# Patient Record
Sex: Female | Born: 1980
Health system: Southern US, Community
[De-identification: ages and names within clinical notes are randomized; demographics above are authoritative.]

## PROBLEM LIST (undated history)

## (undated) DIAGNOSIS — F419 Anxiety disorder, unspecified: Secondary | ICD-10-CM

## (undated) HISTORY — DX: Anxiety disorder, unspecified: F41.9

## (undated) HISTORY — PX: WISDOM TOOTH EXTRACTION: SHX21

## (undated) HISTORY — PX: OTHER SURGICAL HISTORY: SHX169

---

## 2004-10-06 ENCOUNTER — Observation Stay: Payer: Self-pay

## 2004-10-07 ENCOUNTER — Inpatient Hospital Stay: Payer: Self-pay | Admitting: Unknown Physician Specialty

## 2004-10-09 ENCOUNTER — Other Ambulatory Visit: Payer: Self-pay

## 2006-02-06 ENCOUNTER — Observation Stay: Payer: Self-pay

## 2006-02-10 ENCOUNTER — Inpatient Hospital Stay: Payer: Self-pay | Admitting: Unknown Physician Specialty

## 2014-01-29 ENCOUNTER — Emergency Department: Payer: Self-pay | Admitting: Internal Medicine

## 2015-12-03 ENCOUNTER — Encounter: Payer: Self-pay | Admitting: Family Medicine

## 2015-12-03 ENCOUNTER — Ambulatory Visit (INDEPENDENT_AMBULATORY_CARE_PROVIDER_SITE_OTHER): Payer: 59 | Admitting: Family Medicine

## 2015-12-03 VITALS — BP 110/70 | HR 70 | Temp 98.2°F | Resp 16 | Wt 122.8 lb

## 2015-12-03 DIAGNOSIS — R300 Dysuria: Secondary | ICD-10-CM | POA: Diagnosis not present

## 2015-12-03 LAB — POCT URINALYSIS DIPSTICK
Bilirubin, UA: NEGATIVE
Glucose, UA: NEGATIVE
Ketones, UA: NEGATIVE
Nitrite, UA: NEGATIVE
Protein, UA: NEGATIVE
SPEC GRAV UA: 1.015
UROBILINOGEN UA: 0.2
pH, UA: 6

## 2015-12-03 MED ORDER — SULFAMETHOXAZOLE-TRIMETHOPRIM 800-160 MG PO TABS: 1.0000 | ORAL_TABLET | Freq: Two times a day (BID) | ORAL | 0 refills | Status: DC

## 2015-12-03 NOTE — Progress Notes (Signed)
       Patient: Mary EdelmanChristine Michele Jacobo Melton Female    DOB: 1980/05/06   35 y.o.   MRN: [id number] Visit Date: 12/03/2015  Today's Provider: Dortha Kernennis Graycen Degan, PA   Chief Complaint  Patient presents with  . Urinary Tract Infection   Subjective:    Urinary Tract Infection   This is a new problem. Episode onset: Last Tuesday. The problem occurs every urination. The problem has been gradually worsening. The quality of the pain is described as burning. Maximum temperature: Chills/fever yesterday did not checked. Associated symptoms include chills, frequency and urgency. Pertinent negatives include no discharge or hematuria. Associated symptoms comments: Painful Urination and burning. She has tried nothing for the symptoms. The treatment provided no relief.   There are no active problems to display for this patient.  No past medical history on file. No past surgical history on file. No family history on file.   No Known Allergies  No current outpatient prescriptions on file.  Review of Systems  Constitutional: Positive for chills.  Cardiovascular: Negative for chest pain, palpitations and leg swelling.  Gastrointestinal: Positive for abdominal pain (Lower right side).  Genitourinary: Positive for dysuria, frequency and urgency. Negative for hematuria.    Social History  Substance Use Topics  . Smoking status: Current Every Day Smoker    Packs/day: 0.50    Types: Cigarettes  . Smokeless tobacco: Not on file  . Alcohol use Yes     Comment: Occasional   Objective:   BP 110/70 (BP Location: Right Arm, Patient Position: Sitting, Cuff Size: Normal)   Pulse 70   Temp 98.2 F (36.8 C) (Oral)   Resp 16   Wt 122 lb 12.8 oz (55.7 kg)   LMP 11/17/2015   Physical Exam  Constitutional: She is oriented to person, place, and time. She appears well-developed and well-nourished.  HENT:  Head: Normocephalic.  Eyes: Conjunctivae are normal.  Neck: Neck supple.  Cardiovascular:  Normal rate.   Pulmonary/Chest: Effort normal and breath sounds normal.  Abdominal: Soft. Bowel sounds are normal.  Suprapubic discomfort to palpation. Questionable CVA tenderness to percussion posteriorly. No masses.  Neurological: She is alert and oriented to person, place, and time.      Assessment & Plan:     1. Dysuria Onset 1 week ago. No fever today. No hematuria but having some burning with urination. Increase fluid intake and start antibiotic. Will get C&S and recheck pending report. - POCT urinalysis dipstick - Urine culture - sulfamethoxazole-trimethoprim (BACTRIM DS,SEPTRA DS) 800-160 MG tablet; Take 1 tablet by mouth 2 (two) times daily.  Dispense: 14 tablet; Refill: 0       Dortha Kernennis Sharyah Bostwick, PA  [facility]

## 2015-12-05 LAB — URINE CULTURE: ORGANISM ID, BACTERIA: NO GROWTH

## 2015-12-06 ENCOUNTER — Telehealth: Payer: Self-pay

## 2015-12-06 NOTE — Telephone Encounter (Signed)
-----   Message from Tamsen Roersennis E Chrismon, [place] sent at 12/06/2015  8:19 AM EDT ----- No bacterial growth on urine culture. Probably don't need more than 3 days of the antibiotic given. May stop it by Friday. Recheck next week if any urinary tract symptoms.

## 2015-12-06 NOTE — Telephone Encounter (Signed)
Patient has been advised. KW 

## 2016-01-04 ENCOUNTER — Encounter: Payer: Self-pay | Admitting: Family Medicine

## 2016-01-04 ENCOUNTER — Ambulatory Visit (INDEPENDENT_AMBULATORY_CARE_PROVIDER_SITE_OTHER): Payer: 59 | Admitting: Family Medicine

## 2016-01-04 VITALS — BP 98/72 | HR 81 | Temp 97.9°F | Resp 16 | Wt 123.2 lb

## 2016-01-04 DIAGNOSIS — Q828 Other specified congenital malformations of skin: Secondary | ICD-10-CM

## 2016-01-04 NOTE — Progress Notes (Signed)
   Patient: Mary EdelmanChristine Michele Jacobo Hernandez Female    DOB: February 28, 1980   35 y.o.   MRN: [id number] Visit Date: 01/04/2016  Today's Provider: Dortha Kernennis Caylor Cerino, PA   Chief Complaint  Patient presents with  . Foot Pain   Subjective:    Foot Pain  This is a new problem. The current episode started 1 to 4 weeks ago. Episode frequency: when walking. The problem has been unchanged.  Patient reports corns on ball of left foot into the great toe. She states area is only painful when walking.     Previous Medications   No medications on file    Review of Systems  Constitutional: Negative.   Respiratory: Negative.   Cardiovascular: Negative.   Musculoskeletal:       Foot pain     Social History  Substance Use Topics  . Smoking status: Current Every Day Smoker    Packs/day: 0.50    Types: Cigarettes  . Smokeless tobacco: Not on file  . Alcohol use Yes     Comment: Occasional   Objective:   BP 98/72 (BP Location: Right Arm, Patient Position: Sitting, Cuff Size: Normal)   Pulse 81   Temp 97.9 F (36.6 C) (Oral)   Resp 16   Wt 123 lb 3.2 oz (55.9 kg)   Physical Exam  Constitutional: She is oriented to person, place, and time. She appears well-developed and well-nourished. No distress.  HENT:  Head: Normocephalic and atraumatic.  Right Ear: Hearing normal.  Left Ear: Hearing normal.  Nose: Nose normal.  Eyes: Conjunctivae and lids are normal. Right eye exhibits no discharge. Left eye exhibits no discharge. No scleral icterus.  Pulmonary/Chest: Effort normal. No respiratory distress.  Musculoskeletal: Normal range of motion.       Feet:  Neurological: She is alert and oriented to person, place, and time.  Skin: Skin is intact. No lesion noted.  Psychiatric: She has a normal mood and affect. Her speech is normal and behavior is normal. Thought content normal.      Assessment & Plan:     1. Porokeratosis Five small 2-3 mm callus/keratotic lesion on the plantar surface of  the left foot present for the past 3 weeks. Tried some corn pads with acid for the past couple days.. Still having discomfort. Trimmed with scalpel and advised to buff with pumice stone to smooth and remove dry skin. Has an appointment to be evaluated by a podiatrist on 01-22-16. Keep appointment if still uncomfortable. Recheck prn.

## 2016-01-22 ENCOUNTER — Ambulatory Visit (INDEPENDENT_AMBULATORY_CARE_PROVIDER_SITE_OTHER): Payer: 59 | Admitting: Podiatry

## 2016-01-22 ENCOUNTER — Encounter: Payer: Self-pay | Admitting: Podiatry

## 2016-01-22 VITALS — BP 107/76 | HR 64

## 2016-01-22 DIAGNOSIS — B070 Plantar wart: Secondary | ICD-10-CM | POA: Diagnosis not present

## 2016-01-22 DIAGNOSIS — M79672 Pain in left foot: Secondary | ICD-10-CM | POA: Diagnosis not present

## 2016-01-24 ENCOUNTER — Ambulatory Visit (INDEPENDENT_AMBULATORY_CARE_PROVIDER_SITE_OTHER): Payer: 59 | Admitting: Podiatry

## 2016-01-24 ENCOUNTER — Telehealth: Payer: Self-pay | Admitting: *Deleted

## 2016-01-24 DIAGNOSIS — S90822D Blister (nonthermal), left foot, subsequent encounter: Secondary | ICD-10-CM | POA: Diagnosis not present

## 2016-01-24 DIAGNOSIS — M79672 Pain in left foot: Secondary | ICD-10-CM

## 2016-01-24 DIAGNOSIS — B070 Plantar wart: Secondary | ICD-10-CM

## 2016-01-24 NOTE — Telephone Encounter (Signed)
Pt states the area where Dr. Logan BoresEvans worked on her foot looks infected. I spoke with pt and she states the area is bigger than the others and she called and got an appt for today. I told her that on occasion when the wart dies, the fluids from the wart are left beneath the skin causing swelling and pain, often get relief from soaking but best that she has an appt.

## 2016-01-25 NOTE — Progress Notes (Signed)
Subjective:  Patient presents today for follow-up evaluation of painful wart lesions. Patient recently had debridement of plantar verruca with chemical application on 01/22/2016. Patient called into the office this morning complaining of painful blister lesions. Patient presents today for further evaluation    Objective/Physical Exam General: The patient is alert and oriented x3 in no acute distress.  Dermatology: Blister rations noted to the plantar aspect of the forefoot where the plantar verruca were treated. No sign of infectious process noted. Blister lesions are intact and stable. Painful on palpation. Skin is warm, dry and supple bilateral lower extremities. Negative for open lesions or macerations.  Vascular: Palpable pedal pulses bilaterally. No edema or erythema noted. Capillary refill within normal limits.  Neurological: Epicritic and protective threshold grossly intact bilaterally.   Musculoskeletal Exam: Range of motion within normal limits to all pedal and ankle joints bilateral. Muscle strength 5/5 in all groups bilateral.   Assessment: #1 status post debridement of verruca lesions plantar forefoot #2 pain in plantar forefoot #3 blisters, multiple, stable plantar weightbearing surface of the forefoot   Plan of Care:  #1 Patient was evaluated. #2 today we discussed in detail the pathology of verruca lesions and how to manage them. Reaffirmed with the patient that this is a normal finding in the blisters are of normal progression. Discussed with the patient that we are ready discussed about the presentation of blisters after chemical application to the plantar verruca. #3 offloading, padded dressing was applied to the forefoot. #4 postoperative shoe dispensed today. #5 return to clinic on next appointment   Felecia ShellingBrent M. Tarra Pence, DPM [facility]  Dr. Felecia ShellingBrent M. Jamison Yuhasz, DPM   [address]                                        [place], [place] [address]                 Office [phone]  Fax [phone]

## 2016-01-29 ENCOUNTER — Ambulatory Visit: Payer: 59 | Admitting: Podiatry

## 2016-02-01 ENCOUNTER — Ambulatory Visit (INDEPENDENT_AMBULATORY_CARE_PROVIDER_SITE_OTHER): Payer: 59 | Admitting: Podiatry

## 2016-02-01 ENCOUNTER — Encounter: Payer: Self-pay | Admitting: Podiatry

## 2016-02-01 DIAGNOSIS — M79672 Pain in left foot: Secondary | ICD-10-CM

## 2016-02-01 DIAGNOSIS — B070 Plantar wart: Secondary | ICD-10-CM | POA: Diagnosis not present

## 2016-02-01 MED ORDER — NONFORMULARY OR COMPOUNDED ITEM: 1.0000 g | Freq: Every day | 2 refills | Status: DC

## 2016-02-01 NOTE — Progress Notes (Signed)
Subjective: Patient presents today with pain and tenderness on the plantar aspect of the left foot secondary to a plantars wart. Patient states that the pain has been present for several weeks now. Patient denies trauma.  Objective: Physical Exam General: The patient is alert and oriented x3 in no acute distress.  Dermatology: Hyperkeratotic skin lesion noted to the plantar aspect of the left foot approximately 1 cm in diameter. Pinpoint bleeding noted upon debridement. Skin is warm, dry and supple bilateral lower extremities. Negative for open lesions or macerations.  Vascular: Palpable pedal pulses bilaterally. No edema or erythema noted. Capillary refill within normal limits.  Neurological: Epicritic and protective threshold grossly intact bilaterally.   Musculoskeletal Exam: Pain on palpation to the note skin lesion.  Range of motion within normal limits to all pedal and ankle joints bilateral. Muscle strength 5/5 in all groups bilateral.   Assessment: #1 plantar wart left foot #2 pain in left foot   Plan of Care:  #1 Patient was evaluated. #2 Excisional debridement of the plantar wart lesion was performed using a chisel blade. Cantharone was applied and the lesion was dressed with a dry sterile dressing. #3 patient is to return to clinic in 1 week.  Next visit [id number]

## 2016-02-01 NOTE — Progress Notes (Signed)
Subjective: Patient presents today for follow-up evaluation of a plantar wart to the left lower extremity. Patient presents today for follow-up treatment and evaluation  Objective: Physical Exam General: The patient is alert and oriented x3 in no acute distress.  Dermatology: Hyperkeratotic skin lesion noted to the plantar aspect of the left foot approximately 1 cm in diameter. Pinpoint bleeding noted upon debridement. Skin is warm, dry and supple bilateral lower extremities. Negative for open lesions or macerations.  Vascular: Palpable pedal pulses bilaterally. No edema or erythema noted. Capillary refill within normal limits.  Neurological: Epicritic and protective threshold grossly intact bilaterally.   Musculoskeletal Exam: Pain on palpation to the note skin lesion.  Range of motion within normal limits to all pedal and ankle joints bilateral. Muscle strength 5/5 in all groups bilateral.   Assessment: #1 plantar wart left foot #2 pain in left foot   Plan of Care:  #1 Patient was evaluated. #2 Excisional debridement of the plantar wart lesion was performed using a chisel blade. Salicylic acid was applied and the lesion was dressed with a dry sterile dressing. #3 [facility] wart cream [facility] #4 return to clinic prn   Felecia ShellingBrent M. Alahia Whicker, DPM [facility]  Dr. Felecia ShellingBrent M. Meggan Dhaliwal, DPM   [address]                                        [place], [place] [address]                Office [phone]  Fax [phone]

## 2016-09-05 IMAGING — CT CT MAXILLOFACIAL WITHOUT CONTRAST
3 series · 16 of 47 positions shown, 19 images · non-contrast
Comparison: None

CLINICAL DATA: Was doing hand stand and fell striking face last
night, injuries to upper lip and bridge of nose

EXAM:
CT MAXILLOFACIAL WITHOUT CONTRAST
TECHNIQUE: Multidetector CT imaging of the maxillofacial structures was
performed. Multiplanar CT image reconstructions were also generated.
A small metallic BB was placed on the right temple in order to
reliably differentiate right from left.

[Series 2: max soft · axial · 0.27mm/px · z∈[-196,-70]mm · 10 of 73 slices shown, 13 images]
[im 5/73  brain]
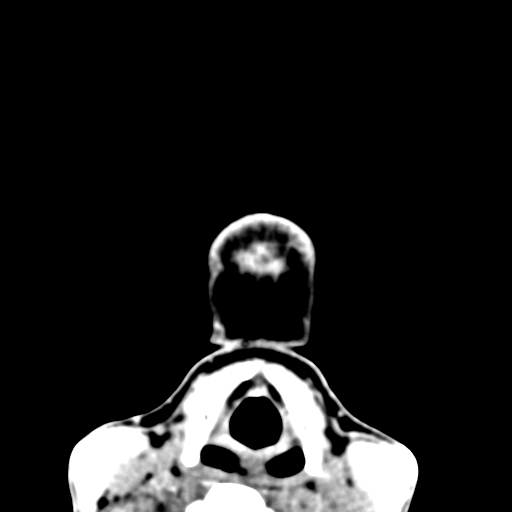
[im 5/73  bone]
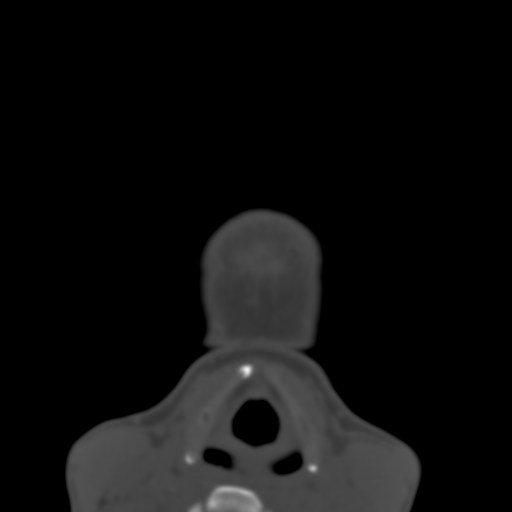
[im 13/73  bone]
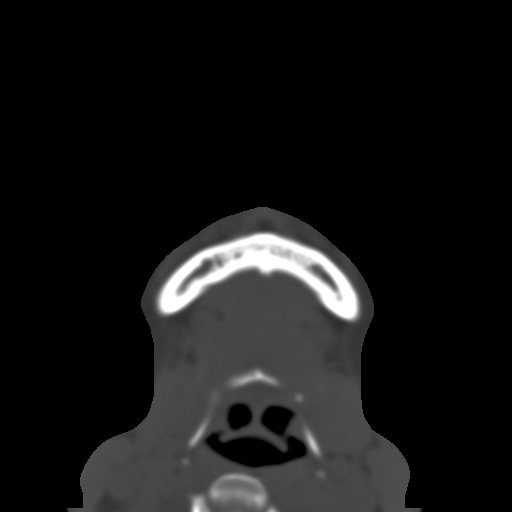
[im 20/73  bone]
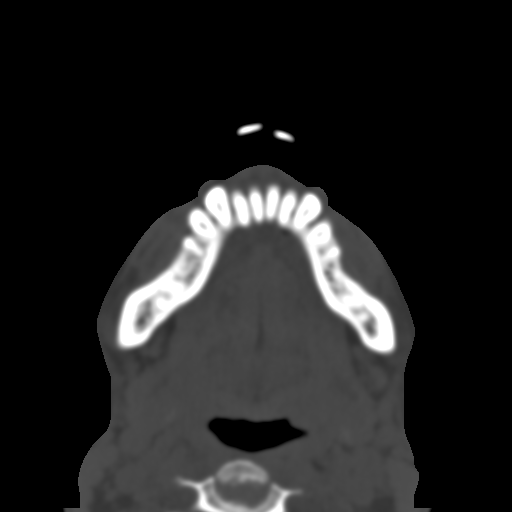
[im 25/73  bone]
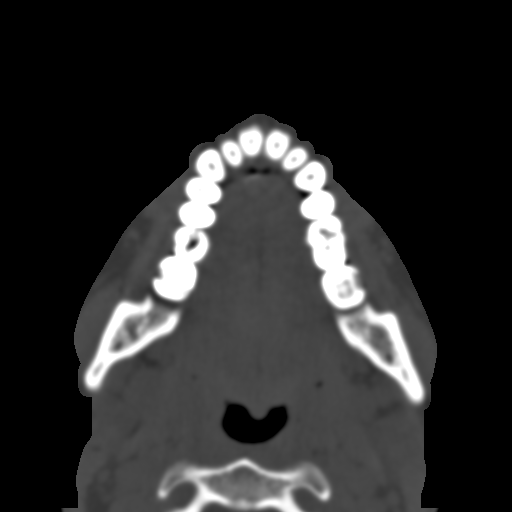
[im 33/73  brain]
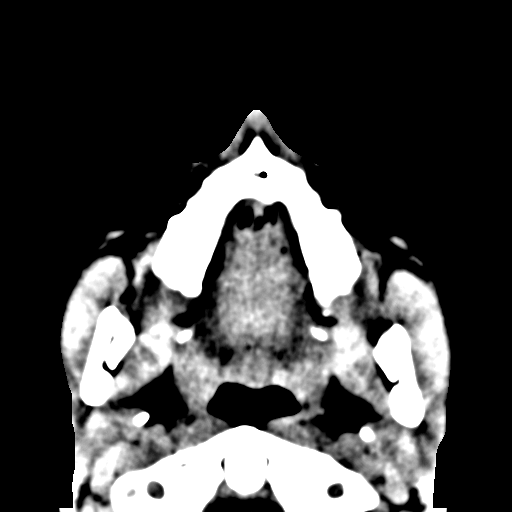
[im 33/73  bone]
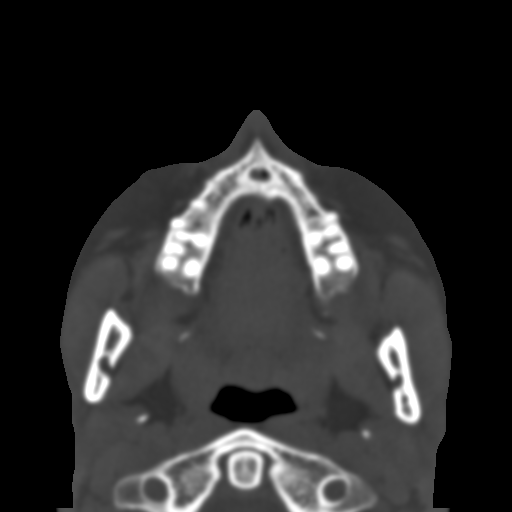
[im 40/73  bone]
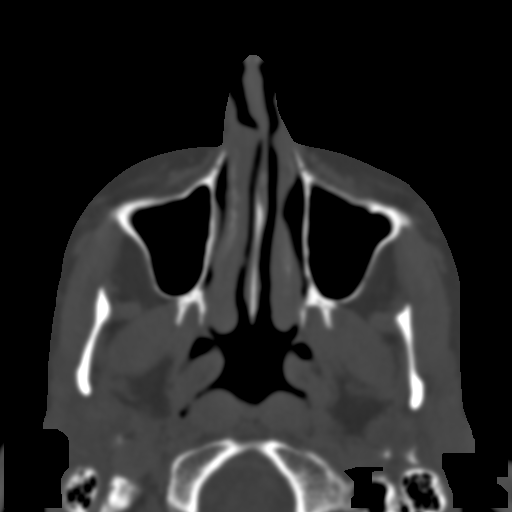
[im 48/73  bone]
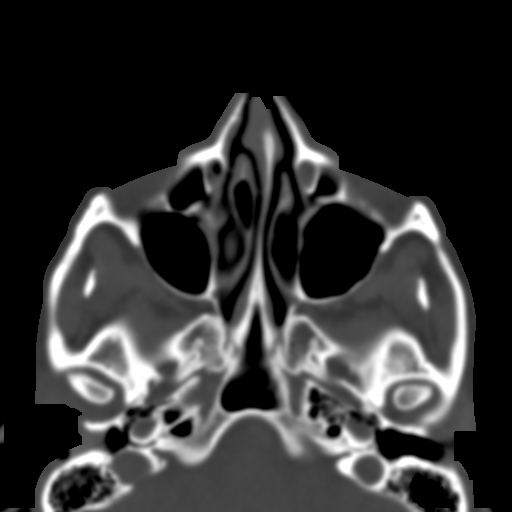
[im 55/73  bone]
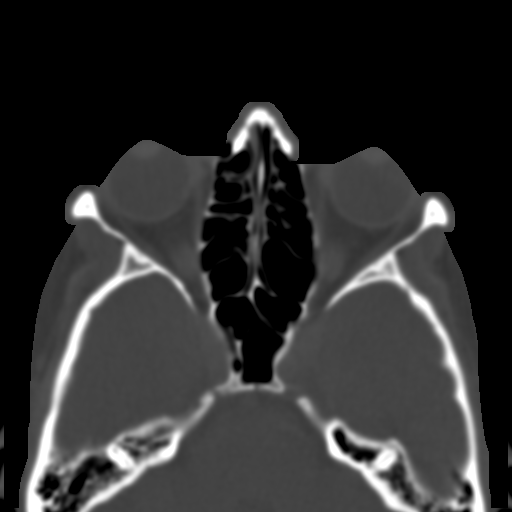
[im 60/73  brain]
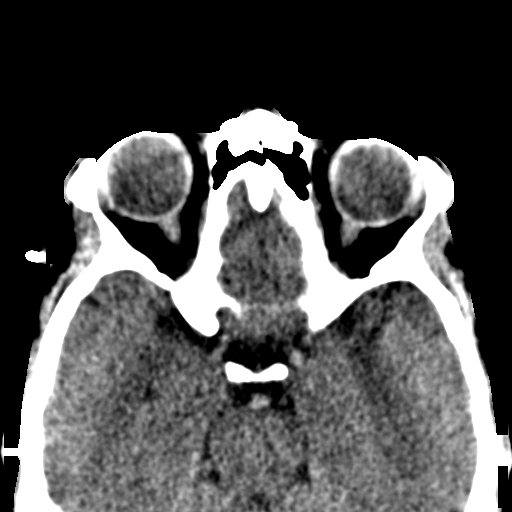
[im 60/73  bone]
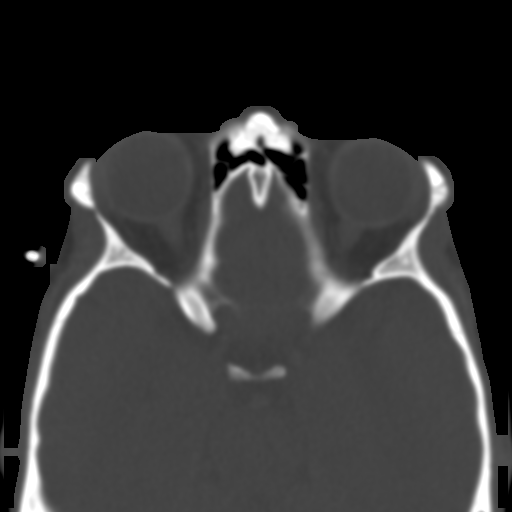
[im 68/73  bone]
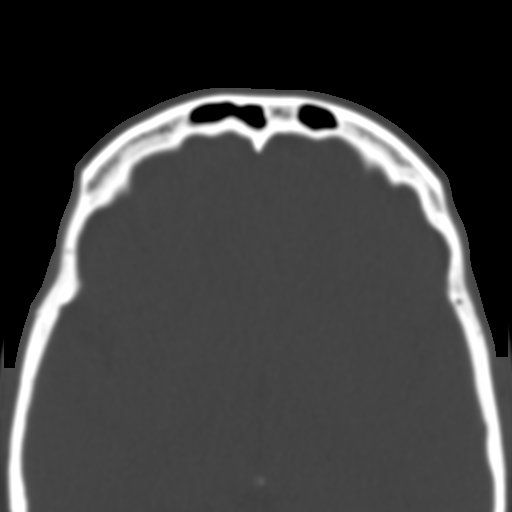

[Series 4: coronal soft · coronal · 0.28mm/px · 3 of 60 slices shown]
[im 20/60  bone]
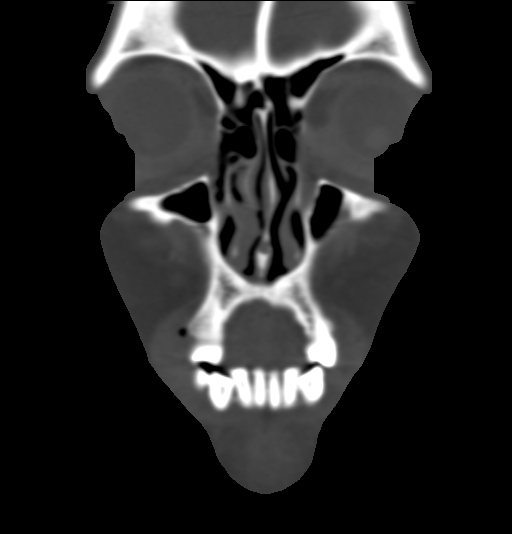
[im 27/60  bone]
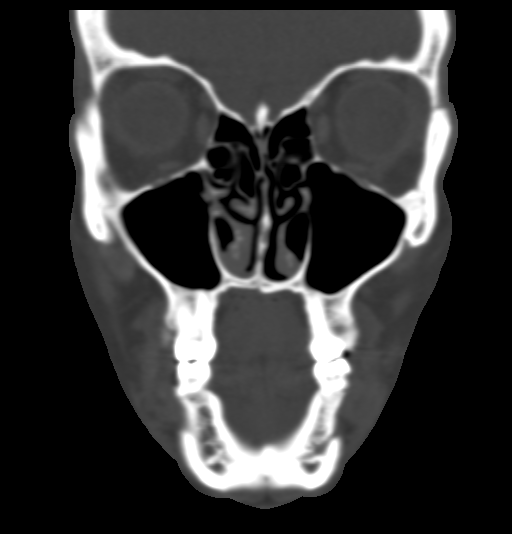
[im 33/60  bone]
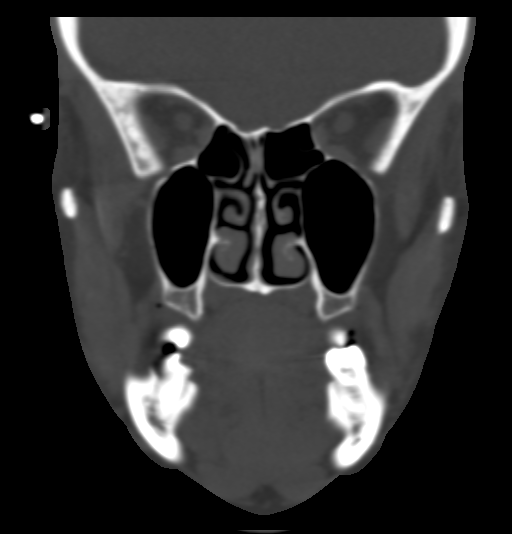

[Series 5: sagittal soft · sagittal · 0.24mm/px · 3 of 67 slices shown]
[im 23/67  bone]
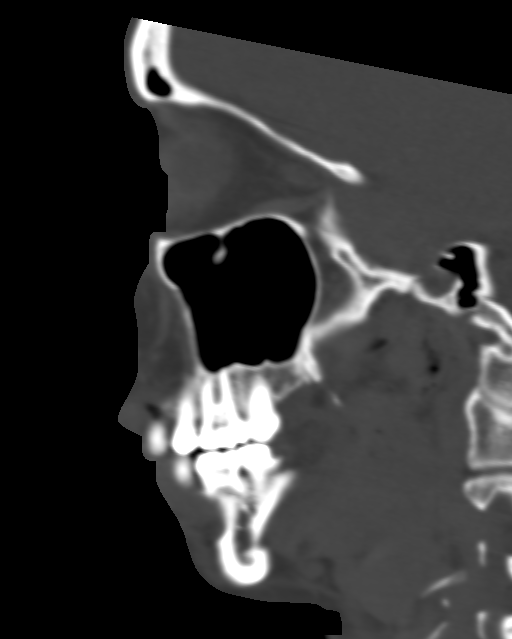
[im 34/67  bone]
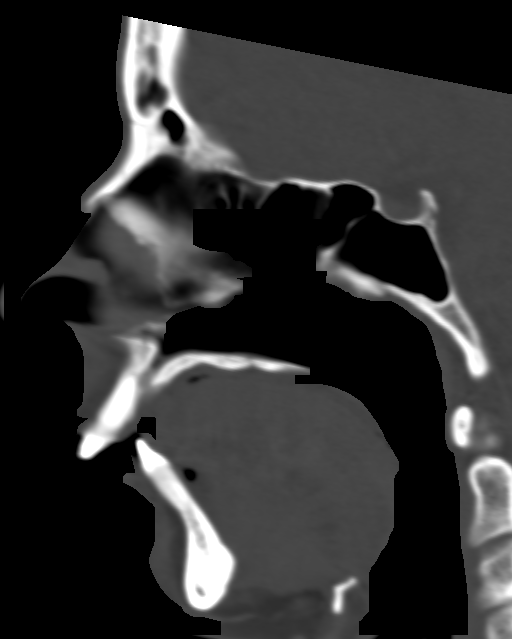
[im 45/67  bone]
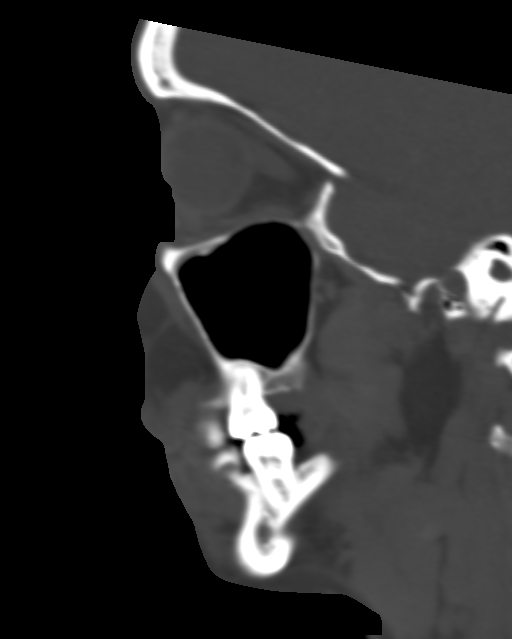

[16 of 47 positions shown; findings below may reference images not displayed]

FINDINGS: Visualized intracranial structures unremarkable.

Intraorbital soft tissue planes clear.

Facial soft tissues otherwise unremarkable.

Paranasal sinuses, visualized mastoid air cells and middle ear
cavities clear.

Material in RIGHT external auditory canal question cerumen.

Nasal septal deviation to the LEFT with concha bullosa of the RIGHT
middle terminate.

No facial bone fractures identified.
IMPRESSION: No acute facial bone abnormalities identified.

## 2017-04-01 ENCOUNTER — Other Ambulatory Visit: Payer: Self-pay | Admitting: Family Medicine

## 2017-04-01 DIAGNOSIS — B852 Pediculosis, unspecified: Secondary | ICD-10-CM

## 2017-04-01 MED ORDER — IVERMECTIN 0.5 % EX LOTN: TOPICAL_LOTION | CUTANEOUS | 0 refills | Status: DC

## 2017-04-01 NOTE — Telephone Encounter (Signed)
[place] [facility] [place]  Lice is back and needs RX for the entire family.

## 2017-04-01 NOTE — Telephone Encounter (Signed)
Sent in

## 2017-06-16 ENCOUNTER — Encounter: Payer: Self-pay | Admitting: Family Medicine

## 2017-06-16 ENCOUNTER — Ambulatory Visit (INDEPENDENT_AMBULATORY_CARE_PROVIDER_SITE_OTHER): Payer: Self-pay | Admitting: Family Medicine

## 2017-06-16 VITALS — BP 106/68 | HR 68 | Temp 98.1°F | Ht 66.0 in | Wt 121.6 lb

## 2017-06-16 DIAGNOSIS — H6121 Impacted cerumen, right ear: Secondary | ICD-10-CM

## 2017-06-16 NOTE — Progress Notes (Signed)
       Patient: Mary Melton Female    DOB: 1981-01-06   37 y.o.   MRN: [id number] Visit Date: 06/16/2017  Today's Provider: Dortha Kern, PA   Chief Complaint  Patient presents with  . Ear Fullness   Subjective:    Ear Fullness   There is pain in the right ear. This is a new problem. Episode onset: 5 days ago. The problem occurs constantly. The problem has been gradually worsening. There has been no fever. Associated symptoms comments: Tinnitus . Treatments tried: OTC wax removal. The treatment provided no relief. There is no history of a chronic ear infection, hearing loss or a tympanostomy tube.   History reviewed. No pertinent past medical history.   Past Surgical History:  Procedure Laterality Date  . arm surgery    . CESAREAN SECTION     2  . WISDOM TOOTH EXTRACTION     Family History  Problem Relation Age of Onset  . COPD Mother   . Healthy Sister   . Diabetes Maternal Grandmother   . Diabetes Maternal Grandfather    No Known Allergies  No current outpatient medications on file.  Review of Systems  Constitutional: Negative.   HENT: Ear pain: ear fullness.   Respiratory: Negative.   Cardiovascular: Negative.     Social History   Tobacco Use  . Smoking status: Current Every Day Smoker    Types: E-cigarettes  . Smokeless tobacco: Never Used  Substance Use Topics  . Alcohol use: Yes    Comment: Occasional   Objective:   BP 106/68 (BP Location: Right Arm, Patient Position: Sitting, Cuff Size: Normal)   Pulse 68   Temp 98.1 F (36.7 C) (Oral)   Ht  (1.676 m)   Wt 121 lb 9.6 oz (55.2 kg)   SpO2 99%   BMI 19.63 kg/m   Physical Exam  Constitutional: She is oriented to person, place, and time. She appears well-developed and well-nourished. No distress.  HENT:  Head: Normocephalic and atraumatic.  Right Ear: Hearing normal.  Left Ear: Hearing and external ear normal.  Nose: Nose normal.  Cerumen impaction occluding the  right ear canal.  Eyes: Conjunctivae and lids are normal. Right eye exhibits no discharge. Left eye exhibits no discharge. No scleral icterus.  Pulmonary/Chest: Effort normal and breath sounds normal. No respiratory distress.  Musculoskeletal: Normal range of motion.  Neurological: She is alert and oriented to person, place, and time.  Skin: Skin is intact. No lesion and no rash noted.  Psychiatric: She has a normal mood and affect. Her speech is normal and behavior is normal. Thought content normal.      Assessment & Plan:     1. Impacted cerumen of right ear Noticed a stopped up sensation in the right ear the past 5 days. Has progressed to some discomfort and decrease in hearing. Cerumen impaction removed with irrigation and TM with normal reflex - no redness or irritation. Recheck prn.       Dortha Kern, PA  [facility]

## 2018-10-19 ENCOUNTER — Ambulatory Visit (INDEPENDENT_AMBULATORY_CARE_PROVIDER_SITE_OTHER): Payer: Self-pay | Admitting: Family Medicine

## 2018-10-19 ENCOUNTER — Encounter: Payer: Self-pay | Admitting: Family Medicine

## 2018-10-19 ENCOUNTER — Other Ambulatory Visit: Payer: Self-pay

## 2018-10-19 DIAGNOSIS — Z20822 Contact with and (suspected) exposure to covid-19: Secondary | ICD-10-CM

## 2018-10-19 DIAGNOSIS — Z20828 Contact with and (suspected) exposure to other viral communicable diseases: Secondary | ICD-10-CM

## 2018-10-19 NOTE — Progress Notes (Signed)
Mary EdelmanChristine Michele Jacobo Melton  MRN: [id number] DOB: 1980-09-14 Virtual Visit via Telephone Note  I connected with Mary Edelmanhristine Michele Jacobo Melton on 10/19/18 at  2:00 PM EDT by telephone and verified that I am speaking with the correct person using two identifiers.  Location: Patient: Home  Provider: Home   I discussed the limitations, risks, security and privacy concerns of performing an evaluation and management service by telephone and the availability of in person appointments. I also discussed with the patient that there may be a patient responsible charge related to this service. The patient expressed understanding and agreed to proceed.  Subjective:  HPI   The patient is a 38 year old female who is being evaluated today via phone interview.  She is requesting a Covid 19 test as she thinks she was exposed by a co-worker.    There are no active problems to display for this patient.  No past medical history on file.   Past Surgical History:  Procedure Laterality Date  . arm surgery    . CESAREAN SECTION     2  . WISDOM TOOTH EXTRACTION     Social History   Socioeconomic History  . Marital status: Divorced    Spouse name: Not on file  . Number of children: Not on file  . Years of education: Not on file  . Highest education level: Not on file  Occupational History  . Not on file  Social Needs  . Financial resource strain: Not on file  . Food insecurity    Worry: Not on file    Inability: Not on file  . Transportation needs    Medical: Not on file    Non-medical: Not on file  Tobacco Use  . Smoking status: Current Every Day Smoker    Types: E-cigarettes  . Smokeless tobacco: Never Used  Substance and Sexual Activity  . Alcohol use: Yes    Comment: Occasional  . Drug use: No  . Sexual activity: Yes  Lifestyle  . Physical activity    Days per week: Not on file    Minutes per session: Not on file  . Stress: Not on file  Relationships  . Social  Musicianconnections    Talks on phone: Not on file    Gets together: Not on file    Attends religious service: Not on file    Active member of club or organization: Not on file    Attends meetings of clubs or organizations: Not on file    Relationship status: Not on file  . Intimate partner violence    Fear of current or ex partner: Not on file    Emotionally abused: Not on file    Physically abused: Not on file    Forced sexual activity: Not on file  Other Topics Concern  . Not on file  Social History Narrative  . Not on file    No outpatient encounter medications on file as of 10/19/2018.   No facility-administered encounter medications on file as of 10/19/2018.     No Known Allergies  Review of Systems  Constitutional: Negative.   HENT: Negative.   Eyes: Negative.   Respiratory: Negative.   Cardiovascular: Negative.   Neurological: Negative for weakness.    Objective:  There were no vitals taken for this visit.  No apparent cough/respiratory symptoms during telephonic interview.   Assessment and Plan :   1. 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 This patient has not had any fever, cough, sore throat, congestion, loss of taste/smell, GI upset or dyspnea. Placed order for COVID test and should monitor for symptoms or fever. Must isolate until free of fever for 3 days after test results available without taking any antipyretic. If positive test, will be isolating for 14 days. Should wear mask at home when around her family or isolate in a separate room. Continue social distancing and frequent handwashing. - Novel Coronavirus, N[facility] ([facility])  I discussed the assessment and treatment plan with the patient. The patient was provided an opportunity to ask questions and all were answered. The patient agreed with the plan and demonstrated an understanding of the instructions.   The patient was advised to call  back or seek an in-person evaluation if the symptoms worsen or if the condition fails to improve as anticipated.  I provided 15 minutes of non-face-to-face time during this encounter.   [name], PA

## 2018-10-20 ENCOUNTER — Other Ambulatory Visit: Payer: Self-pay | Admitting: *Deleted

## 2018-10-20 ENCOUNTER — Encounter (INDEPENDENT_AMBULATORY_CARE_PROVIDER_SITE_OTHER): Payer: Self-pay

## 2018-10-20 DIAGNOSIS — Z20822 Contact with and (suspected) exposure to covid-19: Secondary | ICD-10-CM

## 2018-10-21 ENCOUNTER — Encounter (INDEPENDENT_AMBULATORY_CARE_PROVIDER_SITE_OTHER): Payer: Self-pay

## 2018-10-21 LAB — NOVEL CORONAVIRUS, NAA: SARS-CoV-2, NAA: NOT DETECTED

## 2018-10-22 ENCOUNTER — Encounter (INDEPENDENT_AMBULATORY_CARE_PROVIDER_SITE_OTHER): Payer: Self-pay

## 2018-10-28 ENCOUNTER — Encounter (INDEPENDENT_AMBULATORY_CARE_PROVIDER_SITE_OTHER): Payer: Self-pay

## 2018-12-27 ENCOUNTER — Other Ambulatory Visit: Payer: Self-pay

## 2018-12-27 DIAGNOSIS — Z20822 Contact with and (suspected) exposure to covid-19: Secondary | ICD-10-CM

## 2018-12-28 LAB — NOVEL CORONAVIRUS, NAA: SARS-CoV-2, NAA: NOT DETECTED

## 2019-04-08 ENCOUNTER — Other Ambulatory Visit: Payer: Self-pay

## 2019-04-08 ENCOUNTER — Ambulatory Visit (INDEPENDENT_AMBULATORY_CARE_PROVIDER_SITE_OTHER): Payer: Self-pay | Admitting: Family Medicine

## 2019-04-08 ENCOUNTER — Encounter: Payer: Self-pay | Admitting: Family Medicine

## 2019-04-08 VITALS — BP 112/73 | HR 69 | Temp 97.1°F | Resp 16 | Ht 66.0 in | Wt 165.0 lb

## 2019-04-08 DIAGNOSIS — H6121 Impacted cerumen, right ear: Secondary | ICD-10-CM

## 2019-04-08 NOTE — Progress Notes (Signed)
Patient: Mary Melton Female    DOB: 1980-03-29   39 y.o.   MRN: [id number] Visit Date: 04/08/2019  Today's Provider: Dortha Kern, PA   Chief Complaint  Patient presents with  . Ear Fullness   Subjective:     HPI   Patient is here concerning her right ear. Patient states her right has been stopped up for about 3 days. Patient has been trying to flush her ear out at home with no success. Patient also states she has a sty on her left eye.   History reviewed. No pertinent past medical history.  There are no problems to display for this patient.  Past Surgical History:  Procedure Laterality Date  . arm surgery    . CESAREAN SECTION     2  . WISDOM TOOTH EXTRACTION     Family History  Problem Relation Age of Onset  . COPD Mother   . Healthy Sister   . Diabetes Maternal Grandmother   . Diabetes Maternal Grandfather    No Known Allergies  No current outpatient medications on file.  Review of Systems  Constitutional: Negative for appetite change, chills, fatigue and fever.  Respiratory: Negative for chest tightness and shortness of breath.   Cardiovascular: Negative for chest pain and palpitations.  Gastrointestinal: Negative for abdominal pain, nausea and vomiting.  Neurological: Negative for dizziness and weakness.   Social History   Tobacco Use  . Smoking status: Current Every Day Smoker    Types: E-cigarettes  . Smokeless tobacco: Never Used  Substance Use Topics  . Alcohol use: Yes    Comment: Occasional     Objective:   BP 112/73 (BP Location: Right Arm, Patient Position: Sitting, Cuff Size: Normal)   Pulse 69   Temp (!) 97.1 F (36.2 C) (Other (Comment))   Resp 16   Ht 5\' 6"  (1.676 m)   Wt 165 lb (74.8 kg)   SpO2 97%   BMI 26.63 kg/m  Vitals:   04/08/19 0926  BP: 112/73  Pulse: 69  Resp: 16  Temp: (!) 97.1 F (36.2 C)  TempSrc: Other (Comment)  SpO2: 97%  Weight: 165 lb (74.8 kg)  Height: 5\' 6"  (1.676 m)    Body mass index is 26.63 kg/m.  Physical Exam Constitutional:      General: She is not in acute distress.    Appearance: She is well-developed.  HENT:     Head: Normocephalic and atraumatic.     Right Ear: Hearing normal.     Left Ear: Hearing, tympanic membrane and ear canal normal.     Ears:     Comments: Right canal occluded with wax.    Nose: Nose normal.  Eyes:     General: Lids are normal. No scleral icterus.       Right eye: No discharge.        Left eye: No discharge.     Conjunctiva/sclera: Conjunctivae normal.  Pulmonary:     Effort: Pulmonary effort is normal. No respiratory distress.  Musculoskeletal:        General: Normal range of motion.  Skin:    Findings: No lesion or rash.  Neurological:     Mental Status: She is alert and oriented to person, place, and time.  Psychiatric:        Speech: Speech normal.        Behavior: Behavior normal.        Thought Content: Thought content normal.  Assessment & Plan    1. Impacted cerumen of right ear Onset over the past week without pain or drainage. No URI symptoms or COVID symptoms. After irrigation to remove cerumen, TM shows good reflex and no sign of fluid lines. Recheck prn. Hearing normal.     [name], PA  [facility]

## 2019-04-21 ENCOUNTER — Telehealth: Payer: Self-pay | Admitting: Family Medicine

## 2019-04-21 NOTE — Telephone Encounter (Signed)
Patient is calling regarding a bill for removal of ear wax. She paid $77.00- March & September 2021. And now she received another bill for $77.00. Patient is unsure why is being billed another $77. Please advise. CB- [phone]

## 2019-04-29 NOTE — Telephone Encounter (Signed)
Pt called in to follow up on billing concern. Pt is requesting a call back from office manager as soon as possible to discuss further.    CB: CB- [phone]

## 2019-05-18 NOTE — Telephone Encounter (Signed)
I have tried multiple times to call her but voicemail is full. I sent her an email regarding the bill. cbe

## 2019-11-07 ENCOUNTER — Other Ambulatory Visit: Payer: Self-pay

## 2019-11-07 ENCOUNTER — Encounter: Payer: Self-pay | Admitting: Family Medicine

## 2019-11-07 ENCOUNTER — Telehealth (INDEPENDENT_AMBULATORY_CARE_PROVIDER_SITE_OTHER): Payer: Self-pay | Admitting: Family Medicine

## 2019-11-07 VITALS — Temp 98.7°F

## 2019-11-07 DIAGNOSIS — Z20822 Contact with and (suspected) exposure to covid-19: Secondary | ICD-10-CM

## 2019-11-07 MED ORDER — AZITHROMYCIN 250 MG PO TABS: ORAL_TABLET | ORAL | 0 refills | Status: DC

## 2019-11-07 MED ORDER — PREDNISONE 5 MG PO TABS: ORAL_TABLET | ORAL | 0 refills | Status: DC

## 2019-11-07 NOTE — Progress Notes (Signed)
MyChart Video Visit    Virtual Visit via Video Note   This visit type was conducted due to national recommendations for restrictions regarding the COVID-19 Pandemic (e.g. social distancing) in an effort to limit this patient's exposure and mitigate transmission in our community. This patient is at least at moderate risk for complications without adequate follow up. This format is felt to be most appropriate for this patient at this time. Physical exam was limited by quality of the video and audio technology used for the visit.   Patient location: home Provider location: office  I discussed the limitations of evaluation and management by telemedicine and the availability of in person appointments. The patient expressed understanding and agreed to proceed.  Patient: Mary Melton   DOB: [birth year]   39 y.o. Female  MRN: [id number] Visit Date: 11/07/2019  Today's healthcare provider: Dortha Kern, PA   No chief complaint on file.  Subjective    HPI   Patient  Is a 39 year old female who presents for positive Covid-19 test via video visit.  She began having congestion about 1 week ago and over the weekend began having body aches.  She tested for Covid-19 on 11/04/19 and it was negative.  Then on 11/06/19 she did another test and it was positive.   She states the others in her household have tested negative.  These were all rapid tests and she is scheduled to have PCR testing this afternoon.   History reviewed. No pertinent past medical history. Past Surgical History:  Procedure Laterality Date  . arm surgery    . CESAREAN SECTION     2  . WISDOM TOOTH EXTRACTION     Social History   Tobacco Use  . Smoking status: Current Every Day Smoker    Types: E-cigarettes  . Smokeless tobacco: Never Used  Substance Use Topics  . Alcohol use: Yes    Comment: Occasional  . Drug use: No   Family Status  Relation Name Status  . Mother  Alive  . Father  Alive  .  Sister  Alive  . MGM  Alive  . MGF  Alive  . PGM  Alive  . PGF  Alive   No Known Allergies    Medications: No outpatient medications prior to visit.   No facility-administered medications prior to visit.    Review of Systems  Constitutional: Positive for chills and fatigue. Negative for fever.  HENT: Positive for congestion (head ), sinus pressure and sinus pain. Negative for ear pain, postnasal drip and sore throat.   Respiratory: Positive for cough and shortness of breath (gets winded easily). Negative for chest tightness and wheezing.   Cardiovascular: Negative for chest pain.  Gastrointestinal: Positive for nausea and vomiting. Negative for abdominal pain.  Musculoskeletal: Positive for arthralgias.  Neurological: Positive for headaches. Negative for dizziness.      Objective    Temp 98.7 F (37.1 C) (Oral)    Physical Exam: WDWN female in no acute distress. Appears fatigued. Head: Normocephalic, atraumatic. Neck: Supple, NROM Respiratory: No apparent distress Psych: Normal mood and affect      Assessment & Plan     1. Suspected COVID-19 virus infection Developed fatigue, body aches, some chills with sweats, cough and slight GI upset with diarrhea over the past 2-3 days. Positive rapid OTC COVID test on 11-06-19. Scheduled for PCR test at 3:20 pm today. Temp 98.7 this morning. Boyfriend's mother had a few negative COVID tests but still  having similar symptoms. Recommend she maintain quarantine isolation, use Mucinex-DM for cough and Tylenol or Advil prn aches and pains. Will treat cough and congestion with Azithromycin, Prednisone taper and increase in fluid intake. Maintain COVID restrictions for 10 days since onset of symptoms and free of fever for 2-3 days without antipyretics. May need to consider ER visit if dyspnea or fever worsens. - azithromycin (ZITHROMAX) 250 MG tablet; Take 2 tablets by mouth today then 1 daily for 4 days.  Dispense: 6 tablet; Refill: 0 -  predniSONE (DELTASONE) 5 MG tablet; Taper by mouth daily starting at 6 tablets day 1, 5 day 2, 4 day 3, 3 day 4, 2 day 5 and 1 day 6. Divide dosage among meals and bedtime each day.  Dispense: 21 tablet; Refill: 0 - MyChart COVID-19 home monitoring program - Temperature monitoring; Future   No follow-ups on file.     I discussed the assessment and treatment plan with the patient. The patient was provided an opportunity to ask questions and all were answered. The patient agreed with the plan and demonstrated an understanding of the instructions.   The patient was advised to call back or seek an in-person evaluation if the symptoms worsen or if the condition fails to improve as anticipated.  I provided 20 minutes of non-face-to-face time during this encounter.  Haywood Pao, PA, have reviewed all documentation for this visit. The documentation on 11/07/19 for the exam, diagnosis, procedures, and orders are all accurate and complete.   Dortha Kern, PA [facility] [phone] (phone) [phone] (fax)  [facility]

## 2019-11-08 ENCOUNTER — Encounter (INDEPENDENT_AMBULATORY_CARE_PROVIDER_SITE_OTHER): Payer: Self-pay

## 2019-11-10 ENCOUNTER — Encounter (INDEPENDENT_AMBULATORY_CARE_PROVIDER_SITE_OTHER): Payer: Self-pay

## 2019-11-11 ENCOUNTER — Encounter (INDEPENDENT_AMBULATORY_CARE_PROVIDER_SITE_OTHER): Payer: Self-pay

## 2019-11-15 ENCOUNTER — Encounter (INDEPENDENT_AMBULATORY_CARE_PROVIDER_SITE_OTHER): Payer: Self-pay

## 2020-03-13 ENCOUNTER — Telehealth: Payer: Self-pay

## 2020-03-13 NOTE — Telephone Encounter (Signed)
Copied from CRM [id number]. Topic: Medical Record Request - Other >> Mar 13, 2020 10:52 AM Pawlus, Maxine Glenn A wrote: PT is active on MyChart but still wants a copy of her medical records Please advise.

## 2020-06-13 ENCOUNTER — Telehealth: Payer: Self-pay

## 2020-06-13 ENCOUNTER — Ambulatory Visit: Payer: Self-pay | Admitting: *Deleted

## 2020-06-13 NOTE — Telephone Encounter (Signed)
Copied from CRM [id number]. Topic: General - Other >> Jun 13, 2020  1:11 PM Jaquita Rector A wrote: Reason for CRM: Patient called in to inform Dortha Kern that she may have a UTI and need an Rx sent to her pharmacy. Patient have burning during urination, abdominal discomfort stated that she did a home test that was positive that she have a UTI. Please call Ph# [phone]

## 2020-06-13 NOTE — Telephone Encounter (Signed)
Pt reports burning with urination, urgency and frequency. Burning started today, other symptoms over weekend. Now with lower back pain, constant 4/10. Unsure if febrile. Pt states did at home UTI test, positive for UTI. After hours call. . Advised UC, states will follow disposition.   Reason for Disposition . Side (flank) or lower back pain present  Answer Assessment - Initial Assessment Questions 1. SEVERITY: "How bad is the pain?"  (e.g., Scale 1-10; mild, moderate, or severe)   - MILD (1-3): complains slightly about urination hurting   - MODERATE (4-7): interferes with normal activities     - SEVERE (8-10): excruciating, unwilling or unable to urinate because of the pain      3-4/10, "Pretty constant" 2. FREQUENCY: "How many times have you had painful urination today?"     Unsure 3. PATTERN: "Is pain present every time you urinate or just sometimes?"      Lower back pain 4. ONSET: "When did the painful urination start?"      today 5. FEVER: "Do you have a fever?" If Yes, ask: "What is your temperature, how was it measured, and when did it start?"    no 6. PAST UTI: "Have you had a urine infection before?" If Yes, ask: "When was the last time?" and "What happened that time?"      >5 years 7. CAUSE: "What do you think is causing the painful urination?"  (e.g., UTI, scratch, Herpes sore)     UTI home test positive 8. OTHER SYMPTOMS: "Do you have any other symptoms?" (e.g., flank pain, vaginal discharge, genital sores, urgency, blood in urine)    Odor of urine, no blood, urgency and frequency  Protocols used: URINATION PAIN - FEMALE-A-AH

## 2020-06-13 NOTE — Telephone Encounter (Signed)
Attempted to reach patient to triage but voicemail box is full, if patient calls back please have her speak with [facility] triage nurse to be triaged further about her symptoms. KW

## 2020-06-13 NOTE — Telephone Encounter (Signed)
Pt triaged. Going to UC; after hours call.

## 2020-06-14 NOTE — Telephone Encounter (Signed)
Agree with referral to UC.

## 2020-11-05 ENCOUNTER — Encounter: Payer: Self-pay | Admitting: Family Medicine

## 2020-11-20 ENCOUNTER — Encounter: Payer: Self-pay | Admitting: Family Medicine

## 2020-11-20 ENCOUNTER — Other Ambulatory Visit: Payer: Self-pay

## 2020-11-20 ENCOUNTER — Ambulatory Visit (INDEPENDENT_AMBULATORY_CARE_PROVIDER_SITE_OTHER): Payer: BC Managed Care – PPO | Admitting: Family Medicine

## 2020-11-20 ENCOUNTER — Telehealth: Payer: Self-pay

## 2020-11-20 VITALS — BP 114/73 | HR 83 | Temp 97.3°F | Resp 16 | Ht 66.0 in | Wt 168.5 lb

## 2020-11-20 DIAGNOSIS — Z1231 Encounter for screening mammogram for malignant neoplasm of breast: Secondary | ICD-10-CM

## 2020-11-20 DIAGNOSIS — Z13220 Encounter for screening for lipoid disorders: Secondary | ICD-10-CM

## 2020-11-20 DIAGNOSIS — R635 Abnormal weight gain: Secondary | ICD-10-CM

## 2020-11-20 DIAGNOSIS — D508 Other iron deficiency anemias: Secondary | ICD-10-CM

## 2020-11-20 DIAGNOSIS — F411 Generalized anxiety disorder: Secondary | ICD-10-CM

## 2020-11-20 DIAGNOSIS — F410 Panic disorder [episodic paroxysmal anxiety] without agoraphobia: Secondary | ICD-10-CM

## 2020-11-20 DIAGNOSIS — Z0000 Encounter for general adult medical examination without abnormal findings: Secondary | ICD-10-CM

## 2020-11-20 DIAGNOSIS — F5102 Adjustment insomnia: Secondary | ICD-10-CM | POA: Diagnosis not present

## 2020-11-20 DIAGNOSIS — Z131 Encounter for screening for diabetes mellitus: Secondary | ICD-10-CM

## 2020-11-20 DIAGNOSIS — N946 Dysmenorrhea, unspecified: Secondary | ICD-10-CM

## 2020-11-20 DIAGNOSIS — Z114 Encounter for screening for human immunodeficiency virus [HIV]: Secondary | ICD-10-CM

## 2020-11-20 DIAGNOSIS — Z789 Other specified health status: Secondary | ICD-10-CM

## 2020-11-20 DIAGNOSIS — Z1159 Encounter for screening for other viral diseases: Secondary | ICD-10-CM

## 2020-11-20 DIAGNOSIS — Z136 Encounter for screening for cardiovascular disorders: Secondary | ICD-10-CM

## 2020-11-20 DIAGNOSIS — Z636 Dependent relative needing care at home: Secondary | ICD-10-CM

## 2020-11-20 MED ORDER — TRAZODONE HCL 50 MG PO TABS: 25.0000 mg | ORAL_TABLET | Freq: Every evening | ORAL | 3 refills | Status: DC | PRN

## 2020-11-20 MED ORDER — BUSPIRONE HCL 15 MG PO TABS: 15.0000 mg | ORAL_TABLET | Freq: Two times a day (BID) | ORAL | 0 refills | Status: DC | PRN

## 2020-11-20 NOTE — Assessment & Plan Note (Signed)
Low risk screening; completed today

## 2020-11-20 NOTE — Assessment & Plan Note (Signed)
Check thyroid levels as well as basic blood levels Trial of buspar Encourage use of journal/therapist

## 2020-11-20 NOTE — Assessment & Plan Note (Signed)
Mom of 3 girls- two different dads 1 with bipolar- refusing therapy/treatment Recommend trial of buspar and use of journaling, establish with therapist

## 2020-11-20 NOTE — Assessment & Plan Note (Signed)
Smokes from 1500- qHS Does not feel that she can quit at this time; not ready for counseling

## 2020-11-20 NOTE — Assessment & Plan Note (Signed)
Low risk screening °

## 2020-11-20 NOTE — Assessment & Plan Note (Signed)

## 2020-11-20 NOTE — Assessment & Plan Note (Signed)
Denies concerns; refused exam d/t time constraints

## 2020-11-20 NOTE — Assessment & Plan Note (Signed)
Over past 18 months

## 2020-11-20 NOTE — Telephone Encounter (Signed)
[facility] referring for Severe menstrual cramps. Called and left voicemail for patient to call back to be scheduled.

## 2020-11-20 NOTE — Assessment & Plan Note (Signed)
A1c screening d/t weight gain over 18 months and sleep disturbances

## 2020-11-20 NOTE — Assessment & Plan Note (Signed)
D/t now overweight- sedentary lifestyle Discussed importance of healthy weight management Discussed diet and exercise recommend diet low in saturated fat and regular exercise - 30 min at least 5 times per week

## 2020-11-20 NOTE — Progress Notes (Signed)
Complete physical exam   Patient: Mary Melton   DOB: [birth year]   40 y.o. Female  MRN: [id number] Visit Date: 11/20/2020  Today's healthcare provider: Jacky Kindle, FNP   Chief Complaint  Patient presents with   Annual Exam   Subjective     HPI  Mary Melton is a 40 y.o. female who presents today for a complete physical exam.  She reports consuming a general diet. The patient does not participate in regular exercise at present. She generally feels fairly well. She reports sleeping fairly well. She does have additional problems to discuss today. Patient would like to address today concerns with memory loss that she has noticed in the past 12 months.  Patient has 3 daughters- 2 from one man, and the third from another.  The youngest has been dx'd as Bipolar and is challenging the patient emotionally.  The patient is encouraged to seek therapy herself and begin medication if needed; noted that time constraints are limited her ability to care for herself. Ex- has 6 appts between the 4 of them this week.  History reviewed. No pertinent past medical history. Past Surgical History:  Procedure Laterality Date   arm surgery     CESAREAN SECTION     2   WISDOM TOOTH EXTRACTION     Social History   Socioeconomic History   Marital status: Divorced    Spouse name: Not on file   Number of children: Not on file   Years of education: Not on file   Highest education level: Not on file  Occupational History   Not on file  Tobacco Use   Smoking status: Every Day    Types: E-cigarettes   Smokeless tobacco: Never  Substance and Sexual Activity   Alcohol use: Yes    Comment: Occasional   Drug use: No   Sexual activity: Yes  Other Topics Concern   Not on file  Social History Narrative   Not on file   Social Determinants of Health   Financial Resource Strain: Not on file  Food Insecurity: Not on file  Transportation Needs: Not on  file  Physical Activity: Not on file  Stress: Not on file  Social Connections: Not on file  Intimate Partner Violence: Not on file   Family Status  Relation Name Status   Mother  Alive   Father  Alive   Sister  Alive   MGM  Alive   MGF  Alive   PGM  Alive   PGF  Alive   Family History  Problem Relation Age of Onset   COPD Mother    Healthy Sister    Diabetes Maternal Grandmother    Diabetes Maternal Grandfather    No Known Allergies  Patient Care Team: Chrismon, Jodell Cipro, PA-C (Inactive) as PCP - General (Family Medicine)   Medications: Outpatient Medications Prior to Visit  Medication Sig   [DISCONTINUED] azithromycin (ZITHROMAX) 250 MG tablet Take 2 tablets by mouth today then 1 daily for 4 days.   [DISCONTINUED] predniSONE (DELTASONE) 5 MG tablet Taper by mouth daily starting at 6 tablets day 1, 5 day 2, 4 day 3, 3 day 4, 2 day 5 and 1 day 6. Divide dosage among meals and bedtime each day.   No facility-administered medications prior to visit.    Review of Systems    Objective    BP 114/73   Pulse 83   Temp (!) 97.3 F (36.3 C) (Oral)  Resp 16   Ht 5\' 6"  (1.676 m)   Wt 168 lb 8 oz (76.4 kg)   LMP 10/25/2020 (Exact Date)   BMI 27.20 kg/m    Physical Exam Vitals and nursing note reviewed.  Constitutional:      General: She is awake. She is not in acute distress.    Appearance: Normal appearance. She is well-developed, well-groomed and overweight. She is not ill-appearing, toxic-appearing or diaphoretic.  HENT:     Head: Normocephalic and atraumatic.     Jaw: There is normal jaw occlusion. No trismus, tenderness, swelling or pain on movement.     Right Ear: Hearing, tympanic membrane, ear canal and external ear normal. There is no impacted cerumen.     Left Ear: Hearing, tympanic membrane, ear canal and external ear normal. There is no impacted cerumen.     Nose: Nose normal. No congestion or rhinorrhea.     Right Turbinates: Not enlarged, swollen or  pale.     Left Turbinates: Not enlarged, swollen or pale.     Right Sinus: No maxillary sinus tenderness or frontal sinus tenderness.     Left Sinus: No maxillary sinus tenderness or frontal sinus tenderness.     Mouth/Throat:     Lips: Pink.     Mouth: Mucous membranes are moist. No injury.     Tongue: No lesions.     Pharynx: Oropharynx is clear. Uvula midline. No pharyngeal swelling, oropharyngeal exudate, posterior oropharyngeal erythema or uvula swelling.     Tonsils: No tonsillar exudate or tonsillar abscesses.  Eyes:     General: Lids are normal. Lids are everted, no foreign bodies appreciated. Vision grossly intact. Gaze aligned appropriately. No allergic shiner or visual field deficit.       Right eye: No discharge.        Left eye: No discharge.     Extraocular Movements: Extraocular movements intact.     Conjunctiva/sclera: Conjunctivae normal.     Right eye: Right conjunctiva is not injected. No exudate.    Left eye: Left conjunctiva is not injected. No exudate.    Pupils: Pupils are equal, round, and reactive to light.  Neck:     Thyroid: No thyroid mass, thyromegaly or thyroid tenderness.     Vascular: No carotid bruit.     Trachea: Trachea normal.  Cardiovascular:     Rate and Rhythm: Normal rate and regular rhythm.     Pulses: Normal pulses.          Carotid pulses are 2+ on the right side and 2+ on the left side.      Radial pulses are 2+ on the right side and 2+ on the left side.       Dorsalis pedis pulses are 2+ on the right side and 2+ on the left side.       Posterior tibial pulses are 2+ on the right side and 2+ on the left side.     Heart sounds: Normal heart sounds, S1 normal and S2 normal. No murmur heard.   No friction rub. No gallop.  Pulmonary:     Effort: Pulmonary effort is normal. No respiratory distress.     Breath sounds: Normal breath sounds and air entry. No stridor. No wheezing, rhonchi or rales.  Chest:     Chest wall: No tenderness.      Comments: Breast exam deferred; discussed 'know your lemons' campaign and self exam Abdominal:     General: Abdomen is flat. Bowel sounds are  normal. There is no distension.     Palpations: Abdomen is soft. There is no mass.     Tenderness: There is no abdominal tenderness. There is no right CVA tenderness, left CVA tenderness, guarding or rebound.     Hernia: No hernia is present.  Genitourinary:    Comments: Exam deferred; denies complaints extending beyond heavy cramps- referral to GYN per pt preference Musculoskeletal:        General: No swelling, tenderness, deformity or signs of injury. Normal range of motion.     Cervical back: Full passive range of motion without pain, normal range of motion and neck supple. No edema, rigidity or tenderness. No muscular tenderness.     Right lower leg: No edema.     Left lower leg: No edema.  Lymphadenopathy:     Cervical: No cervical adenopathy.     Right cervical: No superficial, deep or posterior cervical adenopathy.    Left cervical: No superficial, deep or posterior cervical adenopathy.  Skin:    General: Skin is warm and dry.     Capillary Refill: Capillary refill takes less than 2 seconds.     Coloration: Skin is not jaundiced or pale.     Findings: No bruising, erythema, lesion or rash.     Comments: Note of a 'brown raised itchy spot' near R temple that since has flaked off Advised to seek appt sooner if able when ailments arise and establish with derm for annual skin screening; advised that based on presentation s/s- sounds benign in nature- recommend avoid sun exposure and use SPF daily.  Neurological:     General: No focal deficit present.     Mental Status: She is alert and oriented to person, place, and time. Mental status is at baseline.     GCS: GCS eye subscore is 4. GCS verbal subscore is 5. GCS motor subscore is 6.     Sensory: Sensation is intact. No sensory deficit.     Motor: Motor function is intact. No weakness.      Coordination: Coordination is intact. Coordination normal.     Gait: Gait is intact. Gait normal.  Psychiatric:        Attention and Perception: Attention and perception normal.        Mood and Affect: Mood and affect normal.        Speech: Speech normal.        Behavior: Behavior normal. Behavior is cooperative.        Thought Content: Thought content normal.        Cognition and Memory: Cognition and memory normal.        Judgment: Judgment normal.     Last depression screening scores PHQ 2/9 Scores 06/16/2017  PHQ - 2 Score 0   Last fall risk screening Fall Risk  06/16/2017  Falls in the past year? No   Last Audit-C alcohol use screening No flowsheet data found. A score of 3 or more in women, and 4 or more in men indicates increased risk for alcohol abuse, EXCEPT if all of the points are from question 1   No results found for any visits on 11/20/20.  Assessment & Plan    Routine Health Maintenance and Physical Exam  Exercise Activities and Dietary recommendations  Goals   None      There is no immunization history on file for this patient.  Health Maintenance  Topic Date Due   COVID-19 Vaccine (1) Never done   Hepatitis C Screening  Never done   TETANUS/TDAP  Never done   PAP SMEAR-Modifier  Never done   INFLUENZA VACCINE  Never done   HIV Screening  Completed   HPV VACCINES  Aged Out    Discussed health benefits of physical activity, and encouraged her to engage in regular exercise appropriate for her age and condition.  Problem List Items Addressed This Visit       Genitourinary   Severe menstrual cramps   Relevant Orders   Ambulatory referral to Gynecology     Other   Encounter for screening mammogram for malignant neoplasm of breast - Primary    Denies concerns; refused exam d/t time constraints       Relevant Orders   MS DIGITAL SCREENING TOMO BILATERAL   Caregiver stress    Mom of 3 girls- two different dads 1 with bipolar- refusing  therapy/treatment Recommend trial of buspar and use of journaling, establish with therapist       Relevant Medications   busPIRone (BUSPAR) 15 MG tablet   Adjustment insomnia    Recommend sleep hygiene Trial of trazodone      Relevant Medications   traZODone (DESYREL) 50 MG tablet   Encounter for lipid screening for cardiovascular disease    D/t now overweight- sedentary lifestyle Discussed importance of healthy weight management Discussed diet and exercise recommend diet low in saturated fat and regular exercise - 30 min at least 5 times per week       Relevant Orders   Lipid panel   Weight gain    Over past 18 months      Relevant Orders   T4, free   TSH   Hemoglobin A1c   Encounter for screening for HIV    Low risk screening      Relevant Orders   HIV antibody (with reflex)   Encounter for hepatitis C screening test for low risk patient    Low risk screening; completed today      Relevant Orders   Hepatitis C Antibody   Electronic cigarette use    Smokes from 1500- qHS Does not feel that she can quit at this time; not ready for counseling      Generalized anxiety disorder with panic attacks    Check thyroid levels as well as basic blood levels Trial of buspar Encourage use of journal/therapist       Relevant Medications   busPIRone (BUSPAR) 15 MG tablet   traZODone (DESYREL) 50 MG tablet   Other Relevant Orders   Comprehensive metabolic panel   Iron deficiency anemia secondary to inadequate dietary iron intake    Per pt report; repeat labs  Encourage diet high in iron Report of longer menses       Relevant Orders   CBC with Differential/Platelet   Diabetes mellitus screening    A1c screening d/t weight gain over 18 months and sleep disturbances       Relevant Orders   Hemoglobin A1c   Annual physical exam    Things to do to keep yourself healthy  - Exercise at least 30-45 minutes a day, 3-4 days a week.  - Eat a low-fat diet with lots of  fruits and vegetables, up to 7-9 servings per day.  - Seatbelts can save your life. Wear them always.  - Smoke detectors on every level of your home, check batteries every year.  - Eye Doctor - have an eye exam every 1-2 years  - Safe sex - if you may be  exposed to STDs, use a condom.  - Alcohol -  If you drink, do it moderately, less than 2 drinks per day.  - Health Care Power of Attorney. Choose someone to speak for you if you are not able.  - Depression is common in our stressful world.If you're feeling down or losing interest in things you normally enjoy, please come in for a visit.  - Violence - If anyone is threatening or hurting you, please call immediately.  Needs to go to dentist and eye dr- both encouraged; has one appt scheduled         Return in about 2 months (around 01/20/2021) for anxiety and depression.     Leilani Merl, FNP, have reviewed all documentation for this visit. The documentation on 11/20/20 for the exam, diagnosis, procedures, and orders are all accurate and complete.    Jacky Kindle, FNP  [facility] [phone] (phone) [phone] (fax)  [facility]

## 2020-11-20 NOTE — Assessment & Plan Note (Signed)
Per pt report; repeat labs  Encourage diet high in iron Report of longer menses

## 2020-11-20 NOTE — Assessment & Plan Note (Signed)
Recommend sleep hygiene Trial of trazodone

## 2020-11-21 NOTE — Telephone Encounter (Signed)
Voicemail is full unable to leave message

## 2020-11-22 NOTE — Telephone Encounter (Signed)
Patient is scheduled for 11/27/20

## 2020-11-27 ENCOUNTER — Other Ambulatory Visit: Payer: Self-pay

## 2020-11-27 ENCOUNTER — Encounter: Payer: Self-pay | Admitting: Obstetrics and Gynecology

## 2020-11-27 ENCOUNTER — Other Ambulatory Visit (HOSPITAL_COMMUNITY)
Admission: RE | Admit: 2020-11-27 | Discharge: 2020-11-27 | Disposition: A | Discharge status: Home / Self Care | Payer: Self-pay | Source: Ambulatory Visit | Attending: Obstetrics and Gynecology | Admitting: Obstetrics and Gynecology

## 2020-11-27 ENCOUNTER — Ambulatory Visit (INDEPENDENT_AMBULATORY_CARE_PROVIDER_SITE_OTHER): Payer: BC Managed Care – PPO | Admitting: Obstetrics and Gynecology

## 2020-11-27 VITALS — BP 90/70 | Ht 66.0 in | Wt 169.0 lb

## 2020-11-27 DIAGNOSIS — Z1151 Encounter for screening for human papillomavirus (HPV): Secondary | ICD-10-CM

## 2020-11-27 DIAGNOSIS — Z124 Encounter for screening for malignant neoplasm of cervix: Secondary | ICD-10-CM | POA: Insufficient documentation

## 2020-11-27 DIAGNOSIS — R1032 Left lower quadrant pain: Secondary | ICD-10-CM

## 2020-11-27 NOTE — Progress Notes (Signed)
Chrismon, Mary Cipro, PA-C (Inactive)   Chief Complaint  Patient presents with   Pelvic Pain    Left side only randomly, severe cramping during cycles at times.    HPI:      [name] is a 40 y.o. No obstetric history on file. whose LMP was Patient's last menstrual period was 11/23/2020 (approximate)., presents today for NP eval of LLQ pain and pap smear, referred by PCP. Pt has random, sharp, fleeting, episodic LLQ pains lasting a minute or 2, for the past 5-6 months. Sx can be once during a day or a few times in a day. No aggrav/allev factors. Doesn't need to take meds for sx due to short duration. No correlation with menstrual cycle. No GI or urin sx, no LBP, fevers. Pt doesn't do specific exercise but is active at work, sometimes lifting/carrying items. She is sex active, no pain/bleeding. Uses condoms, declines BC. No hx of ovar cysts. No FH ovar/uterine/colon cancer.  Menses are monthly, lasting 5-6 days up to 8 days, mod flow, no clots, no BTB, mild dysmen.   No recent pap. No hx of abn paps, no hx of STDs.   Past Medical History:  Diagnosis Date   Anxiety     Past Surgical History:  Procedure Laterality Date   arm surgery     CESAREAN SECTION     2   WISDOM TOOTH EXTRACTION      Family History  Problem Relation Age of Onset   COPD Mother    Healthy Sister    Cancer - Colon Maternal Uncle    Diabetes Maternal Grandmother    Diabetes Maternal Grandfather    Breast cancer Maternal Great-grandmother        late years    Social History   Socioeconomic History   Marital status: Divorced    Spouse name: Not on file   Number of children: Not on file   Years of education: Not on file   Highest education level: Not on file  Occupational History   Not on file  Tobacco Use   Smoking status: Every Day    Types: E-cigarettes   Smokeless tobacco: Never  Vaping Use   Vaping Use: Every day  Substance and Sexual Activity   Alcohol use:  Yes    Comment: Occasional   Drug use: No   Sexual activity: Yes    Birth control/protection: None, Condom  Other Topics Concern   Not on file  Social History Narrative   Not on file   Social Determinants of Health   Financial Resource Strain: Not on file  Food Insecurity: Not on file  Transportation Needs: Not on file  Physical Activity: Not on file  Stress: Not on file  Social Connections: Not on file  Intimate Partner Violence: Not on file    Outpatient Medications Prior to Visit  Medication Sig Dispense Refill   busPIRone (BUSPAR) 15 MG tablet Take 1 tablet (15 mg total) by mouth 2 (two) times daily as needed. 30 tablet 0   traZODone (DESYREL) 50 MG tablet Take 0.5-1 tablets (25-50 mg total) by mouth at bedtime as needed for sleep. 30 tablet 3   No facility-administered medications prior to visit.      ROS:  Review of Systems  Constitutional:  Negative for fever.  Gastrointestinal:  Negative for blood in stool, constipation, diarrhea, nausea and vomiting.  Genitourinary:  Positive for pelvic pain. Negative for dyspareunia, dysuria, flank pain, frequency, hematuria, urgency,  vaginal bleeding, vaginal discharge and vaginal pain.  Musculoskeletal:  Negative for back pain.  Skin:  Negative for rash.  BREAST: No symptoms   OBJECTIVE:   Vitals:  BP 90/70   Ht 5\' 6"  (1.676 m)   Wt 169 lb (76.7 kg)   LMP 11/23/2020 (Approximate)   BMI 27.28 kg/m   Physical Exam Vitals reviewed.  Constitutional:      Appearance: She is well-developed.  Pulmonary:     Effort: Pulmonary effort is normal.  Abdominal:     Palpations: Abdomen is soft.     Tenderness: There is abdominal tenderness in the left lower quadrant. There is no guarding or rebound.     Comments: MINIMAL LLQ TENDERNESS  Genitourinary:    General: Normal vulva.     Pubic Area: No rash.      Labia:        Right: No rash, tenderness or lesion.        Left: No rash, tenderness or lesion.      Vagina:  Normal. No vaginal discharge, erythema or tenderness.     Cervix: Normal.     Uterus: Normal. Not enlarged and not tender.      Adnexa: Right adnexa normal and left adnexa normal.       Right: No mass or tenderness.         Left: No mass or tenderness.    Musculoskeletal:        General: Normal range of motion.     Cervical back: Normal range of motion.  Skin:    General: Skin is warm and dry.  Neurological:     General: No focal deficit present.     Mental Status: She is alert and oriented to person, place, and time.  Psychiatric:        Mood and Affect: Mood normal.        Behavior: Behavior normal.        Thought Content: Thought content normal.        Judgment: Judgment normal.    Assessment/Plan: LLQ pain - Plan: 11/25/2020 PELVIS TRANSVAGINAL NON-OB (TV ONLY); fleeting sx periodically for 5-6 months. Slightly tender on abd exam. Check GYN u/s. If neg, question MSK. Pt to keep journal of sx in case related to ovulation, BM, activity. F/u prn  Cervical cancer screening - Plan: Cytology - PAP  Screening for HPV (human papillomavirus) - Plan: Cytology - PAP     Return in about 1 week (around 12/04/2020) for GYN u/s for LLQ pain--[facility] to call pt.  Nikkita Adeyemi B. Bayard More, PA-C 11/27/2020 2:30 PM

## 2020-11-27 NOTE — Patient Instructions (Signed)
I value your feedback and you entrusting us with your care. If you get a [place] patient survey, I would appreciate you taking the time to let us know about your experience today. Thank you! ? ? ?

## 2020-11-29 LAB — CYTOLOGY - PAP
Comment: NEGATIVE
Diagnosis: NEGATIVE
High risk HPV: NEGATIVE

## 2020-12-10 ENCOUNTER — Ambulatory Visit (INDEPENDENT_AMBULATORY_CARE_PROVIDER_SITE_OTHER): Payer: BC Managed Care – PPO

## 2020-12-10 ENCOUNTER — Other Ambulatory Visit: Payer: Self-pay | Admitting: Obstetrics and Gynecology

## 2020-12-10 ENCOUNTER — Other Ambulatory Visit: Payer: Self-pay

## 2020-12-10 DIAGNOSIS — R1032 Left lower quadrant pain: Secondary | ICD-10-CM | POA: Diagnosis not present

## 2020-12-17 ENCOUNTER — Encounter: Payer: Self-pay | Admitting: Obstetrics and Gynecology

## 2020-12-17 ENCOUNTER — Ambulatory Visit (INDEPENDENT_AMBULATORY_CARE_PROVIDER_SITE_OTHER): Payer: BC Managed Care – PPO | Admitting: Obstetrics and Gynecology

## 2020-12-17 ENCOUNTER — Other Ambulatory Visit: Payer: Self-pay

## 2020-12-17 VITALS — BP 98/70 | Ht 66.0 in | Wt 170.0 lb

## 2020-12-17 DIAGNOSIS — N939 Abnormal uterine and vaginal bleeding, unspecified: Secondary | ICD-10-CM

## 2020-12-17 DIAGNOSIS — Z30432 Encounter for removal of intrauterine contraceptive device: Secondary | ICD-10-CM | POA: Diagnosis not present

## 2020-12-17 NOTE — Progress Notes (Signed)
   Chief Complaint  Patient presents with   IUD removal    Not interested in new [facility]     History of Present Illness:  Sha Burling is a 40 y.o. that had a IUD placed approximately 15 years ago. Pt forgot about it until identified on GYN u/s 12/10/20 for LLQ pain for past 5-6 months. Gyn neg for LTO pathology, but showed small leio, EM=17.8 mm with leio. Having some BTB this cycle, unusual for pt. Wondered if IUD is causing LLQ pains and now AUB. Will remove today. Declines other BC.  BP 98/70   Ht 5\' 6"  (1.676 m)   Wt 170 lb (77.1 kg)   LMP 11/23/2020 (Approximate)   BMI 27.44 kg/m   Pelvic exam:  Two IUD strings present seen coming from the cervical os. Barely visible/not palpable.  EGBUS, vaginal vault and cervix: within normal limits  IUD Removal Strings of IUD identified and grasped.  IUD removed without problem with ring forceps.  Pt tolerated this well.  IUD noted to be intact.  Assessment:  Encounter for IUD removal  Abnormal uterine bleeding (AUB)--this cycle only, see if sx resolve after IUD removal. F/u if sx persist. EM=17.8 mm with leio on GYN u/s, but wasn't having BTB till this cycle.   LLQ pains--see if sx improve with IUD removal  Declines BC for now.  Ozan Maclay B. Jeffie Spivack, PA-C 12/17/2020 4:56 PM

## 2021-06-24 NOTE — Progress Notes (Unsigned)
I,[name],acting as a scribe for Gwyneth Sprout, FNP.,have documented all relevant documentation on the behalf of Gwyneth Sprout, FNP,as directed by  Gwyneth Sprout, FNP while in the presence of Gwyneth Sprout, FNP.   Established patient visit   Patient: Mary Melton   DOB: 08-Jan-1981   41 y.o. Female  MRN: [id number] Visit Date: 06/25/2021  Today's healthcare provider: Gwyneth Sprout, FNP  Re Introduced to nurse practitioner role and practice setting.  All questions answered.  Discussed provider/patient relationship and expectations.  Chief Complaint  Patient presents with   Ear Fullness   Subjective    HPI  Patient presents with diminished hearing for the past a few days in Right ear. Reports that she has a mild pain off an on in Left ear.  There is a prior history of cerumen impaction.  The patient was using ear drops to loosen wax immediately prior to this visit. Associated symptoms: congestion.  Medications: Outpatient Medications Prior to Visit  Medication Sig   traZODone (DESYREL) 50 MG tablet Take 0.5-1 tablets (25-50 mg total) by mouth at bedtime as needed for sleep.   [DISCONTINUED] busPIRone (BUSPAR) 15 MG tablet Take 1 tablet (15 mg total) by mouth 2 (two) times daily as needed. (Patient not taking: Reported on 12/17/2020)   No facility-administered medications prior to visit.    Review of Systems  Constitutional:  Negative for appetite change, chills, fatigue and fever.  HENT:  Positive for congestion and hearing loss (rigt ear). Negative for ear discharge.   Respiratory:  Negative for chest tightness and shortness of breath.   Cardiovascular:  Negative for chest pain and palpitations.  Gastrointestinal:  Negative for abdominal pain, nausea and vomiting.  Neurological:  Negative for dizziness and weakness.      Objective    BP 109/76 (BP Location: Right Arm, Patient Position: Sitting, Cuff Size: Normal)   Pulse 64   Resp 16   Wt  165 lb 1.6 oz (74.9 kg)   BMI 26.65 kg/m    Physical Exam Vitals and nursing note reviewed.  Constitutional:      General: She is not in acute distress.    Appearance: Normal appearance. She is overweight. She is not ill-appearing, toxic-appearing or diaphoretic.  HENT:     Head: Normocephalic and atraumatic.     Right Ear: Tympanic membrane, ear canal and external ear normal. Decreased hearing noted. There is impacted cerumen.     Left Ear: Hearing, ear canal and external ear normal. Tenderness present. Tympanic membrane is injected and erythematous.     Ears:      Nose: Congestion and rhinorrhea present.     Mouth/Throat:     Mouth: Mucous membranes are moist.     Pharynx: Oropharynx is clear. No oropharyngeal exudate or posterior oropharyngeal erythema.  Eyes:     General:        Right eye: No discharge.        Left eye: No discharge.     Extraocular Movements: Extraocular movements intact.     Conjunctiva/sclera: Conjunctivae normal.     Pupils: Pupils are equal, round, and reactive to light.  Cardiovascular:     Rate and Rhythm: Normal rate and regular rhythm.     Pulses: Normal pulses.     Heart sounds: Normal heart sounds. No murmur heard.   No friction rub. No gallop.  Pulmonary:     Effort: Pulmonary effort is normal. No respiratory  distress.     Breath sounds: Normal breath sounds. No stridor. No wheezing, rhonchi or rales.  Chest:     Chest wall: No tenderness.  Abdominal:     General: Bowel sounds are normal.     Palpations: Abdomen is soft.  Musculoskeletal:        General: No swelling, tenderness, deformity or signs of injury. Normal range of motion.     Right lower leg: No edema.     Left lower leg: No edema.  Skin:    General: Skin is warm and dry.     Capillary Refill: Capillary refill takes less than 2 seconds.     Coloration: Skin is not jaundiced or pale.     Findings: No bruising, erythema, lesion or rash.  Neurological:     General: No focal  deficit present.     Mental Status: She is alert and oriented to person, place, and time. Mental status is at baseline.     Cranial Nerves: No cranial nerve deficit.     Sensory: No sensory deficit.     Motor: No weakness.     Coordination: Coordination normal.  Psychiatric:        Mood and Affect: Mood normal.        Behavior: Behavior normal.        Thought Content: Thought content normal.        Judgment: Judgment normal.      No results found for any visits on 06/25/21.  Assessment & Plan     Problem List Items Addressed This Visit       Nervous and Auditory   Excessive cerumen in ear canal, left    Ceruminosis is noted.  Wax is removed by syringing and manual debridement with curette. Instructions for home care to prevent wax buildup are given with Debrox. Reddened, painful TM following removal. Augmentin provided for acute infection for BID dosing for 10 days. Will refer to ENT if pain/tenderness remains following treatment.         Hearing loss of right ear due to cerumen impaction    Ceruminosis is noted.  Wax is removed by syringing and manual debridement with curette. Instructions for home care to prevent wax buildup are given with Debrox. Normal TM following removal.         Non-recurrent acute suppurative otitis media of left ear without spontaneous rupture of tympanic membrane - Primary    Acute infection noted following cerumen removal.       Relevant Medications   amoxicillin-clavulanate (AUGMENTIN) 875-125 MG tablet     Other   Nasal congestion with rhinorrhea    Acute, stable. Encourage use of flonase to assist with associated cerumen buildup. Currently not using any anti-histamines.        Relevant Medications   fluticasone (FLONASE) 50 MCG/ACT nasal spray     Return if symptoms worsen or fail to improve.      Vonna Kotyk, FNP, have reviewed all documentation for this visit. The documentation on 06/25/21 for the exam, diagnosis,  procedures, and orders are all accurate and complete.    Gwyneth Sprout, [place] [phone] (phone) [phone] (fax)  [place]

## 2021-06-25 ENCOUNTER — Ambulatory Visit (INDEPENDENT_AMBULATORY_CARE_PROVIDER_SITE_OTHER): Payer: BC Managed Care – PPO | Admitting: Family Medicine

## 2021-06-25 ENCOUNTER — Encounter: Payer: Self-pay | Admitting: Family Medicine

## 2021-06-25 VITALS — BP 109/76 | HR 64 | Resp 16 | Wt 165.1 lb

## 2021-06-25 DIAGNOSIS — H6122 Impacted cerumen, left ear: Secondary | ICD-10-CM

## 2021-06-25 DIAGNOSIS — H66002 Acute suppurative otitis media without spontaneous rupture of ear drum, left ear: Secondary | ICD-10-CM

## 2021-06-25 DIAGNOSIS — R0981 Nasal congestion: Secondary | ICD-10-CM

## 2021-06-25 DIAGNOSIS — J3489 Other specified disorders of nose and nasal sinuses: Secondary | ICD-10-CM | POA: Insufficient documentation

## 2021-06-25 DIAGNOSIS — H6121 Impacted cerumen, right ear: Secondary | ICD-10-CM | POA: Diagnosis not present

## 2021-06-25 MED ORDER — AMOXICILLIN-POT CLAVULANATE 875-125 MG PO TABS: 1.0000 | ORAL_TABLET | Freq: Two times a day (BID) | ORAL | 0 refills | Status: DC

## 2021-06-25 MED ORDER — FLUTICASONE PROPIONATE 50 MCG/ACT NA SUSP: 2.0000 | Freq: Every day | NASAL | 6 refills | Status: DC

## 2021-06-25 NOTE — Assessment & Plan Note (Signed)
Ceruminosis is noted.  Wax is removed by syringing and manual debridement with curette. Instructions for home care to prevent wax buildup are given with Debrox. Reddened, painful TM following removal. Augmentin provided for acute infection for BID dosing for 10 days. Will refer to ENT if pain/tenderness remains following treatment.

## 2021-06-25 NOTE — Assessment & Plan Note (Signed)
Acute infection noted following cerumen removal.

## 2021-06-25 NOTE — Assessment & Plan Note (Addendum)
Ceruminosis is noted.  Wax is removed by syringing and manual debridement with curette. Instructions for home care to prevent wax buildup are given with Debrox. Normal TM following removal.

## 2021-06-25 NOTE — Assessment & Plan Note (Signed)
Acute, stable. Encourage use of flonase to assist with associated cerumen buildup. Currently not using any anti-histamines.

## 2021-07-15 ENCOUNTER — Encounter: Payer: Self-pay | Admitting: Family Medicine

## 2021-07-15 DIAGNOSIS — B3731 Acute candidiasis of vulva and vagina: Secondary | ICD-10-CM

## 2021-07-15 MED ORDER — FLUCONAZOLE 150 MG PO TABS: 150.0000 mg | ORAL_TABLET | Freq: Once | ORAL | 0 refills | Status: AC

## 2021-07-17 ENCOUNTER — Encounter: Payer: Self-pay | Admitting: Obstetrics and Gynecology

## 2021-07-17 DIAGNOSIS — B379 Candidiasis, unspecified: Secondary | ICD-10-CM

## 2021-07-17 MED ORDER — FLUCONAZOLE 150 MG PO TABS: 150.0000 mg | ORAL_TABLET | Freq: Once | ORAL | 0 refills | Status: AC

## 2021-07-17 NOTE — Telephone Encounter (Signed)
Diflucan sent to pharmacy. Pt aware via phone call. Advised to call our office if sx do not clear.

## 2021-08-22 ENCOUNTER — Ambulatory Visit (INDEPENDENT_AMBULATORY_CARE_PROVIDER_SITE_OTHER): Payer: BC Managed Care – PPO | Admitting: Podiatry

## 2021-08-22 ENCOUNTER — Ambulatory Visit (INDEPENDENT_AMBULATORY_CARE_PROVIDER_SITE_OTHER): Payer: BC Managed Care – PPO

## 2021-08-22 DIAGNOSIS — M79672 Pain in left foot: Secondary | ICD-10-CM | POA: Diagnosis not present

## 2021-08-22 DIAGNOSIS — M722 Plantar fascial fibromatosis: Secondary | ICD-10-CM | POA: Diagnosis not present

## 2021-08-22 DIAGNOSIS — M79671 Pain in right foot: Secondary | ICD-10-CM

## 2021-08-22 DIAGNOSIS — T148XXA Other injury of unspecified body region, initial encounter: Secondary | ICD-10-CM

## 2021-08-28 NOTE — Progress Notes (Signed)
Subjective:  Patient ID: Mary Melton, female    DOB: [birth year],  MRN: [id number]  No chief complaint on file.   41 y.o. female presents with the above complaint.  Patient presents with primary complaint left heel pain that has been on for quite some time.  Pain is painful to touch is progressive gotten worse is worse side.  She states is painful to walk on history taking for step in the morning is very painful.  She has not seen anyone else prior to seeing me.  She is also having right pain to the foot 2.  She has been right foot soft tissue contusion that also has been going for a weeks and hurts mostly on top of the foot around the foot and ankle.   Review of Systems: Negative except as noted in the HPI. Denies N/V/F/Ch.  Past Medical History:  Diagnosis Date   Anxiety     Current Outpatient Medications:    amoxicillin-clavulanate (AUGMENTIN) 875-125 MG tablet, Take 1 tablet by mouth 2 (two) times daily., Disp: 20 tablet, Rfl: 0   fluticasone (FLONASE) 50 MCG/ACT nasal spray, Place 2 sprays into both nostrils daily., Disp: 16 g, Rfl: 6   traZODone (DESYREL) 50 MG tablet, Take 0.5-1 tablets (25-50 mg total) by mouth at bedtime as needed for sleep., Disp: 30 tablet, Rfl: 3  Social History   Tobacco Use  Smoking Status Every Day   Types: E-cigarettes  Smokeless Tobacco Never    No Known Allergies Objective:  There were no vitals filed for this visit. There is no height or weight on file to calculate BMI. Constitutional Well developed. Well nourished.  Vascular Dorsalis pedis pulses palpable bilaterally. Posterior tibial pulses palpable bilaterally. Capillary refill normal to all digits.  No cyanosis or clubbing noted. Pedal hair growth normal.  Neurologic Normal speech. Oriented to person, place, and time. Epicritic sensation to light touch grossly present bilaterally.  Dermatologic Nails well groomed and normal in appearance. No open wounds. No  skin lesions.  Orthopedic: Normal joint ROM without pain or crepitus bilaterally. No visible deformities. Tender to palpation at the calcaneal tuber left. No pain with calcaneal squeeze left. Ankle ROM diminished range of motion left. Silfverskiold Test: positive left.  Generalized pain noted to the right dorsal foot.  Some arthritic changes clinically appreciated.  Negative piano key test negative Lisfranc injury.  No extensor or flexor tendinitis noted.   Radiographs: Taken and reviewed. No acute fractures or dislocations. No evidence of stress fracture.  Plantar heel spur present. Posterior heel spur absent.  Pes planovalgus foot deformity noted.  No fractures noted to the right side.  Assessment:   1. Contusion of soft tissue   2. Plantar fasciitis of left foot    Plan:  Patient was evaluated and treated and all questions answered.  Right soft tissue generalized contusion -All questions and concerns were discussed with the patient in extensive detail given the amount of pain that she is having she will benefit from cam boot immobilization to the right side.  Cam boot was dispensed.  We can discuss steroid injection at her next clinic visit if there is no resolve  Plantar Fasciitis, left - XR reviewed as above.  - Educated on icing and stretching. Instructions given.  - Injection delivered to the plantar fascia as below. - DME: Plantar fascial brace dispensed to support the medial longitudinal arch of the foot and offload pressure from the heel and prevent arch collapse during weightbearing - Pharmacologic  management: None  Procedure: Injection Tendon/Ligament Location: Left plantar fascia at the glabrous junction; medial approach. Skin Prep: alcohol Injectate: 0.5 cc 0.5% marcaine plain, 0.5 cc of 1% Lidocaine, 0.5 cc kenalog 10. Disposition: Patient tolerated procedure well. Injection site dressed with a band-aid.  No follow-ups on file.

## 2021-09-19 ENCOUNTER — Ambulatory Visit (INDEPENDENT_AMBULATORY_CARE_PROVIDER_SITE_OTHER): Payer: BC Managed Care – PPO | Admitting: Podiatry

## 2021-09-19 DIAGNOSIS — M722 Plantar fascial fibromatosis: Secondary | ICD-10-CM | POA: Diagnosis not present

## 2021-09-19 DIAGNOSIS — Q666 Other congenital valgus deformities of feet: Secondary | ICD-10-CM | POA: Diagnosis not present

## 2021-09-19 NOTE — Progress Notes (Signed)
Left platnar fasciits second injection night pslint and orhtotics     Subjective:  Patient ID: Mary Melton, female    DOB: 1981-01-28,  MRN: [id number]  Chief Complaint  Patient presents with   Follow-up   Plantar Fasciitis    Follow-up for plantar fasciitis left foot.     41 y.o. female presents with the above complaint.  Presents for follow-up to left plantar fascia's.  She states she is doing a lot better the injection helped now she still has some residual pain is coming back slowly.  She wanted to discuss next treatment plan.  She wears [employer].  She denies any other acute complaints.  She states it started to bother her more when she is wearing her steel toe boots   Review of Systems: Negative except as noted in the HPI. Denies N/V/F/Ch.  Past Medical History:  Diagnosis Date   Anxiety     Current Outpatient Medications:    amoxicillin-clavulanate (AUGMENTIN) 875-125 MG tablet, Take 1 tablet by mouth 2 (two) times daily., Disp: 20 tablet, Rfl: 0   fluticasone (FLONASE) 50 MCG/ACT nasal spray, Place 2 sprays into both nostrils daily., Disp: 16 g, Rfl: 6   traZODone (DESYREL) 50 MG tablet, Take 0.5-1 tablets (25-50 mg total) by mouth at bedtime as needed for sleep., Disp: 30 tablet, Rfl: 3  Social History   Tobacco Use  Smoking Status Every Day   Types: E-cigarettes  Smokeless Tobacco Never    No Known Allergies Objective:  There were no vitals filed for this visit. There is no height or weight on file to calculate BMI. Constitutional Well developed. Well nourished.  Vascular Dorsalis pedis pulses palpable bilaterally. Posterior tibial pulses palpable bilaterally. Capillary refill normal to all digits.  No cyanosis or clubbing noted. Pedal hair growth normal.  Neurologic Normal speech. Oriented to person, place, and time. Epicritic sensation to light touch grossly present bilaterally.  Dermatologic Nails well groomed and normal  in appearance. No open wounds. No skin lesions.  Orthopedic: Normal joint ROM without pain or crepitus bilaterally. No visible deformities. Tender to palpation at the calcaneal tuber left. No pain with calcaneal squeeze left. Ankle ROM diminished range of motion left. Silfverskiold Test: positive left.  Generalized pain noted to the right dorsal foot.  Some arthritic changes clinically appreciated.  Negative piano key test negative Lisfranc injury.  No extensor or flexor tendinitis noted.   Radiographs: Taken and reviewed. No acute fractures or dislocations. No evidence of stress fracture.  Plantar heel spur present. Posterior heel spur absent.  Pes planovalgus foot deformity noted.  No fractures noted to the right side.  Assessment:   1. Plantar fasciitis of left foot   2. Pes planovalgus     Plan:  Patient was evaluated and treated and all questions answered.  Right soft tissue generalized contusion -Clinic resolved  Plantar Fasciitis, left - XR reviewed as above.  - Educated on icing and stretching. Instructions given.  - Second Injection delivered to the plantar fascia as below. - DME: Night splint was dispensed  - Pharmacologic management: None  Pes planovalgus -I explained to patient the etiology of pes planovalgus and relationship with Planter fasciitis and various treatment options were discussed.  Given patient foot structure in the setting of Planter fasciitis I believe patient will benefit from custom-made orthotics to help control the hindfoot motion support the arch of the foot and take the stress away from plantar fascial.  Patient agrees with the plan  like to proceed with orthotics -Patient was casted for orthotics   Procedure: Injection Tendon/Ligament Location: Left plantar fascia at the glabrous junction; medial approach. Skin Prep: alcohol Injectate: 0.5 cc 0.5% marcaine plain, 0.5 cc of 1% Lidocaine, 0.5 cc kenalog 10. Disposition: Patient tolerated  procedure well. Injection site dressed with a band-aid.  No follow-ups on file.

## 2021-10-17 ENCOUNTER — Ambulatory Visit (INDEPENDENT_AMBULATORY_CARE_PROVIDER_SITE_OTHER): Payer: BC Managed Care – PPO | Admitting: Podiatry

## 2021-10-17 DIAGNOSIS — M722 Plantar fascial fibromatosis: Secondary | ICD-10-CM

## 2021-10-29 NOTE — Progress Notes (Signed)
Left platnar fasciits second injection night pslint and orhtotics     Subjective:  Patient ID: Mary Melton, female    DOB: 18-Jun-1980,  MRN: [id number]  Chief Complaint  Patient presents with   Plantar Fasciitis    41 y.o. female presents with the above complaint.  Patient Mary Melton for follow-up to left plantar fascia's.  The injection did not help.  She would like to discuss next treatment plan.  Denies any other acute complaints.   Review of Systems: Negative except as noted in the HPI. Denies N/V/F/Ch.  Past Medical History:  Diagnosis Date   Anxiety     Current Outpatient Medications:    amoxicillin-clavulanate (AUGMENTIN) 875-125 MG tablet, Take 1 tablet by mouth 2 (two) times daily., Disp: 20 tablet, Rfl: 0   fluticasone (FLONASE) 50 MCG/ACT nasal spray, Place 2 sprays into both nostrils daily., Disp: 16 g, Rfl: 6   traZODone (DESYREL) 50 MG tablet, Take 0.5-1 tablets (25-50 mg total) by mouth at bedtime as needed for sleep., Disp: 30 tablet, Rfl: 3  Social History   Tobacco Use  Smoking Status Every Day   Types: E-cigarettes  Smokeless Tobacco Never    No Known Allergies Objective:  There were no vitals filed for this visit. There is no height or weight on file to calculate BMI. Constitutional Well developed. Well nourished.  Vascular Dorsalis pedis pulses palpable bilaterally. Posterior tibial pulses palpable bilaterally. Capillary refill normal to all digits.  No cyanosis or clubbing noted. Pedal hair growth normal.  Neurologic Normal speech. Oriented to person, place, and time. Epicritic sensation to light touch grossly present bilaterally.  Dermatologic Nails well groomed and normal in appearance. No open wounds. No skin lesions.  Orthopedic: Normal joint ROM without pain or crepitus bilaterally. No visible deformities. Tender to palpation at the calcaneal tuber left. No pain with calcaneal squeeze left. Ankle ROM diminished range of  motion left. Silfverskiold Test: positive left.  Generalized pain noted to the right dorsal foot.  Some arthritic changes clinically appreciated.  Negative piano key test negative Lisfranc injury.  No extensor or flexor tendinitis noted.   Radiographs: Taken and reviewed. No acute fractures or dislocations. No evidence of stress fracture.  Plantar heel spur present. Posterior heel spur absent.  Pes planovalgus foot deformity noted.  No fractures noted to the right side.  Assessment:   No diagnosis found.   Plan:  Patient was evaluated and treated and all questions answered.  Right soft tissue generalized contusion -Clinic resolved  Plantar Fasciitis, left - XR reviewed as above.  - Educated on icing and stretching. Instructions given.  -Patient did not help we will hold off on further injection - DME: Cam boot was dispensed. - Pharmacologic management: None  Pes planovalgus -I explained to patient the etiology of pes planovalgus and relationship with Planter fasciitis and various treatment options were discussed.  Given patient foot structure in the setting of Planter fasciitis I believe patient will benefit from custom-made orthotics to help control the hindfoot motion support the arch of the foot and take the stress away from plantar fascial.  Patient agrees with the plan like to proceed with orthotics -Patient was casted for orthotics    No follow-ups on file.

## 2021-11-01 ENCOUNTER — Ambulatory Visit (INDEPENDENT_AMBULATORY_CARE_PROVIDER_SITE_OTHER): Payer: BC Managed Care – PPO | Admitting: Podiatry

## 2021-11-01 DIAGNOSIS — M722 Plantar fascial fibromatosis: Secondary | ICD-10-CM

## 2021-11-01 DIAGNOSIS — Q666 Other congenital valgus deformities of feet: Secondary | ICD-10-CM

## 2021-11-01 NOTE — Patient Instructions (Signed)

## 2021-11-01 NOTE — Progress Notes (Signed)
Patient presents today to pick up custom molded foot orthotics recommended by Dr. Posey Pronto.   Orthotics were dispensed and fit was satisfactory. Reviewed instructions for break-in and wear. Written instructions given to patient.  Patient will follow up as needed.   [facility] - order # [id number]

## 2021-11-14 ENCOUNTER — Encounter: Payer: Self-pay | Admitting: Podiatry

## 2021-11-14 ENCOUNTER — Ambulatory Visit (INDEPENDENT_AMBULATORY_CARE_PROVIDER_SITE_OTHER): Payer: BC Managed Care – PPO | Admitting: Podiatry

## 2021-11-14 DIAGNOSIS — M722 Plantar fascial fibromatosis: Secondary | ICD-10-CM | POA: Diagnosis not present

## 2021-11-20 NOTE — Progress Notes (Signed)
Left platnar fasciits second injection night pslint and orhtotics     Subjective:  Patient ID: Mary Melton, female    DOB: 06-12-1980,  MRN: [id number]  Chief Complaint  Patient presents with   Plantar Fasciitis    Pt stated that there has been some improvement but still has some soreness     41 y.o. female presents with the above complaint.  Patient Mary Melton for follow-up to left plantar fascia's.  She is a cam boot immobilization helped.  She would like to continue managing it.  She wants to think about all of her options she has orthotics and is working well with   Review of Systems: Negative except as noted in the HPI. Denies N/V/F/Ch.  Past Medical History:  Diagnosis Date   Anxiety     Current Outpatient Medications:    fluconazole (DIFLUCAN) 150 MG tablet, Take 150 mg by mouth once., Disp: , Rfl:   Social History   Tobacco Use  Smoking Status Every Day   Types: E-cigarettes  Smokeless Tobacco Never    No Known Allergies Objective:  There were no vitals filed for this visit. There is no height or weight on file to calculate BMI. Constitutional Well developed. Well nourished.  Vascular Dorsalis pedis pulses palpable bilaterally. Posterior tibial pulses palpable bilaterally. Capillary refill normal to all digits.  No cyanosis or clubbing noted. Pedal hair growth normal.  Neurologic Normal speech. Oriented to person, place, and time. Epicritic sensation to light touch grossly present bilaterally.  Dermatologic Nails well groomed and normal in appearance. No open wounds. No skin lesions.  Orthopedic: Normal joint ROM without pain or crepitus bilaterally. No visible deformities. Tender to palpation at the calcaneal tuber left. No pain with calcaneal squeeze left. Ankle ROM diminished range of motion left. Silfverskiold Test: positive left.  Generalized pain noted to the right dorsal foot.  Some arthritic changes clinically appreciated.   Negative piano key test negative Lisfranc injury.  No extensor or flexor tendinitis noted.   Radiographs: Taken and reviewed. No acute fractures or dislocations. No evidence of stress fracture.  Plantar heel spur present. Posterior heel spur absent.  Pes planovalgus foot deformity noted.  No fractures noted to the right side.  Assessment:   No diagnosis found.   Plan:  Patient was evaluated and treated and all questions answered.  Right soft tissue generalized contusion -Clinic resolved  Plantar Fasciitis, left -We will can continue to manage.  There is some improvement.  She has been using orthotics.  If it does not get better we will discuss PRP injection versus surgical options.  Pes planovalgus -I explained to patient the etiology of pes planovalgus and relationship with Planter fasciitis and various treatment options were discussed.  Given patient foot structure in the setting of Planter fasciitis I believe patient will benefit from custom-made orthotics to help control the hindfoot motion support the arch of the foot and take the stress away from plantar fascial.  Patient agrees with the plan like to proceed with orthotics -Patient has obtained orthotics and is functioning well in them.    No follow-ups on file.

## 2021-12-30 ENCOUNTER — Encounter: Payer: BC Managed Care – PPO | Admitting: Family Medicine

## 2021-12-31 ENCOUNTER — Ambulatory Visit (INDEPENDENT_AMBULATORY_CARE_PROVIDER_SITE_OTHER): Payer: BC Managed Care – PPO | Admitting: Family Medicine

## 2021-12-31 ENCOUNTER — Encounter: Payer: Self-pay | Admitting: Family Medicine

## 2021-12-31 ENCOUNTER — Encounter: Payer: BC Managed Care – PPO | Admitting: Family Medicine

## 2021-12-31 ENCOUNTER — Other Ambulatory Visit: Payer: Self-pay | Admitting: Family Medicine

## 2021-12-31 VITALS — BP 121/89 | HR 82 | Temp 97.7°F | Resp 16 | Ht 66.0 in | Wt 169.7 lb

## 2021-12-31 DIAGNOSIS — Z0000 Encounter for general adult medical examination without abnormal findings: Secondary | ICD-10-CM | POA: Diagnosis not present

## 2021-12-31 DIAGNOSIS — Z833 Family history of diabetes mellitus: Secondary | ICD-10-CM | POA: Diagnosis not present

## 2021-12-31 DIAGNOSIS — Z789 Other specified health status: Secondary | ICD-10-CM

## 2021-12-31 NOTE — Patient Instructions (Signed)

## 2021-12-31 NOTE — Progress Notes (Signed)
BP 121/89 (BP Location: Left Arm, Patient Position: Sitting, Cuff Size: Normal)   Pulse 82   Temp 97.7 F (36.5 C) (Oral)   Resp 16   Ht 5\' 6"  (1.676 m)   Wt 169 lb 11.2 oz (77 kg)   SpO2 100%   BMI 27.39 kg/m    Subjective:    Patient ID: , female    DOB: 1980/12/18, 41 y.o.   MRN: [id number]  HPI: Mary Melton is a 41 y.o. female presenting on 12/31/2021 for comprehensive medical examination. Current medical complaints include:none  Menopausal Symptoms: no  Depression Screen done today and results listed below:     12/31/2021    1:24 PM 06/25/2021   10:38 AM 06/16/2017    4:02 PM  Depression screen PHQ 2/9  Decreased Interest 0 1 0  Down, Depressed, Hopeless 0 1 0  PHQ - 2 Score 0 2 0  Altered sleeping 1 1   Tired, decreased energy 1 2   Change in appetite 0 0   Feeling bad or failure about yourself  0 1   Trouble concentrating 0 1   Moving slowly or fidgety/restless 0 0   Suicidal thoughts 0 0   PHQ-9 Score 2 7   Difficult doing work/chores Not difficult at all Somewhat difficult     Past Medical History:  Past Medical History:  Diagnosis Date   Anxiety     Surgical History:  Past Surgical History:  Procedure Laterality Date   arm surgery     CESAREAN SECTION     2   WISDOM TOOTH EXTRACTION      Medications:  No current outpatient medications on file prior to visit.   No current facility-administered medications on file prior to visit.    Allergies:  No Known Allergies  Social History:  Social History   Socioeconomic History   Marital status: Divorced    Spouse name: Not on file   Number of children: Not on file   Years of education: Not on file   Highest education level: Not on file  Occupational History   Not on file  Tobacco Use   Smoking status: Every Day    Types: E-cigarettes   Smokeless tobacco: Never  Vaping Use   Vaping Use: Every day  Substance and Sexual Activity    Alcohol use: Yes    Comment: Occasional   Drug use: No   Sexual activity: Yes    Birth control/protection: Condom, I.U.D.  Other Topics Concern   Not on file  Social History Narrative   Not on file   Social Determinants of Health   Financial Resource Strain: Not on file  Food Insecurity: Not on file  Transportation Needs: Not on file  Physical Activity: Not on file  Stress: Not on file  Social Connections: Not on file  Intimate Partner Violence: Not on file   Social History   Tobacco Use  Smoking Status Every Day   Types: E-cigarettes  Smokeless Tobacco Never   Social History   Substance and Sexual Activity  Alcohol Use Yes   Comment: Occasional    Family History:  Family History  Problem Relation Age of Onset   COPD Mother    Healthy Sister    Cancer - Colon Maternal Uncle    Diabetes Maternal Grandmother    Diabetes Maternal Grandfather    Breast cancer Maternal Great-grandmother        late years    Past  medical history, surgical history, medications, allergies, family history and social history reviewed with patient today and changes made to appropriate areas of the chart.      Objective:    BP 121/89 (BP Location: Left Arm, Patient Position: Sitting, Cuff Size: Normal)   Pulse 82   Temp 97.7 F (36.5 C) (Oral)   Resp 16   Ht 5\' 6"  (1.676 m)   Wt 169 lb 11.2 oz (77 kg)   SpO2 100%   BMI 27.39 kg/m   Wt Readings from Last 3 Encounters:  12/31/21 169 lb 11.2 oz (77 kg)  06/25/21 165 lb 1.6 oz (74.9 kg)  12/17/20 170 lb (77.1 kg)    Physical Exam Vitals reviewed.  Constitutional:      Appearance: Normal appearance.  HENT:     Head: Normocephalic.     Right Ear: Tympanic membrane, ear canal and external ear normal.     Left Ear: Tympanic membrane, ear canal and external ear normal.     Nose: Nose normal.     Mouth/Throat:     Mouth: Mucous membranes are moist.     Pharynx: Oropharynx is clear.  Eyes:     Extraocular Movements: Extraocular  movements intact.     Pupils: Pupils are equal, round, and reactive to light.  Cardiovascular:     Rate and Rhythm: Normal rate and regular rhythm.     Heart sounds: Normal heart sounds. No murmur heard. Pulmonary:     Effort: Pulmonary effort is normal.     Breath sounds: Normal breath sounds.  Abdominal:     General: Bowel sounds are normal.     Palpations: Abdomen is soft.     Tenderness: There is no abdominal tenderness.  Musculoskeletal:        General: Normal range of motion.     Cervical back: Normal range of motion.     Right lower leg: No edema.     Left lower leg: No edema.  Lymphadenopathy:     Cervical: No cervical adenopathy.  Skin:    General: Skin is warm and dry.  Neurological:     Mental Status: She is alert and oriented to person, place, and time. Mental status is at baseline.     Gait: Gait normal.  Psychiatric:        Mood and Affect: Mood normal.        Behavior: Behavior normal.     Results for orders placed or performed in visit on 11/27/20  Cytology - PAP  Result Value Ref Range   High risk HPV Negative    Adequacy      Satisfactory for evaluation; transformation zone component PRESENT.   Diagnosis      - Negative for intraepithelial lesion or malignancy (NILM)   Comment Normal Reference Range HPV - Negative       Assessment & Plan:   Problem List Items Addressed This Visit       Other   Electronic cigarette use   Other Visit Diagnoses     Annual physical exam    -  Primary   Relevant Orders   CBC with Differential/Platelet   Comprehensive metabolic panel   Lipid panel   MM 3D SCREEN BREAST BILATERAL   Hemoglobin A1c   Hepatitis C antibody   Family history of diabetes mellitus       Relevant Orders   Hemoglobin A1c        Follow up plan: Return in about 1 year (around  01/01/2023) for cpe.   LABORATORY TESTING:  - Pap smear: up to date  IMMUNIZATIONS:   - Influenza: Refused - Pneumovax: Not applicable - Prevnar: Not  applicable - HPV: Not applicable - Shingrix vaccine: Not applicable - COVID vaccine: has received 3 doses of mRNA vaccine  SCREENING: - Mammogram: Ordered today  - Colonoscopy: Not applicable  - Bone Density: Not applicable  - Lung Cancer Screening: Not applicable   Hep C Screening: ordered today STD testing and prevention (HIV/chl/gon/syphilis): no concerns Sexual History: sexually active Menstrual History/LMP/Abnormal Bleeding: condoms Incontinence Symptoms: none reported  Osteoporosis: Discussed high calcium and vitamin D supplementation, weight bearing exercises  Advanced Care Planning: A voluntary discussion about advance care planning including the explanation and discussion of advance directives.  Discussed health care proxy and Living will, and the patient was able to identify a health care proxy as mom, Debbie Scoggins.  Patient does not have a living will at present time. If patient does have living will, I have requested they bring this to the clinic to be scanned in to their chart.  PATIENT COUNSELING:   Advised to take 1 mg of folate supplement per day if capable of pregnancy.   Sexuality: Discussed sexually transmitted diseases, partner selection, use of condoms, avoidance of unintended pregnancy  and contraceptive alternatives.   Advised to avoid cigarette smoking.  I discussed with the patient that most people either abstain from alcohol or drink within safe limits (<=14/week and <=4 drinks/occasion for males, <=7/weeks and <= 3 drinks/occasion for females) and that the risk for alcohol disorders and other health effects rises proportionally with the number of drinks per week and how often a drinker exceeds daily limits.  Discussed cessation/primary prevention of drug use and availability of treatment for abuse.   Diet: Encouraged to adjust caloric intake to maintain  or achieve ideal body weight, to reduce intake of dietary saturated fat and total fat, to limit sodium  intake by avoiding high sodium foods and not adding table salt, and to maintain adequate dietary potassium and calcium preferably from fresh fruits, vegetables, and low-fat dairy products.    Stressed the importance of regular exercise.  Injury prevention: Discussed safety belts, safety helmets, smoke detector, smoking near bedding or upholstery.   Dental health: Discussed importance of regular tooth brushing, flossing, and dental visits.    NEXT PREVENTATIVE PHYSICAL DUE IN 1 YEAR. Return in about 1 year (around 01/01/2023) for cpe.

## 2022-01-01 LAB — COMPREHENSIVE METABOLIC PANEL
ALT: 14 IU/L (ref 0–32)
AST: 20 IU/L (ref 0–40)
Albumin/Globulin Ratio: 1.8 (ref 1.2–2.2)
Albumin: 4.5 g/dL (ref 3.9–4.9)
Alkaline Phosphatase: 62 IU/L (ref 44–121)
BUN/Creatinine Ratio: 19 (ref 9–23)
BUN: 14 mg/dL (ref 6–24)
Bilirubin Total: 1.2 mg/dL (ref 0.0–1.2)
CO2: 24 mmol/L (ref 20–29)
Calcium: 9.5 mg/dL (ref 8.7–10.2)
Chloride: 104 mmol/L (ref 96–106)
Creatinine, Ser: 0.72 mg/dL (ref 0.57–1.00)
Globulin, Total: 2.5 g/dL (ref 1.5–4.5)
Glucose: 91 mg/dL (ref 70–99)
Potassium: 4.2 mmol/L (ref 3.5–5.2)
Sodium: 142 mmol/L (ref 134–144)
Total Protein: 7 g/dL (ref 6.0–8.5)
eGFR: 106 mL/min/{1.73_m2} (ref >59)

## 2022-01-01 LAB — HEPATITIS C ANTIBODY: Hep C Virus Ab: NONREACTIVE

## 2022-01-01 LAB — CBC WITH DIFFERENTIAL/PLATELET
Basophils Absolute: 0.1 10*3/uL (ref 0.0–0.2)
Basos: 1 %
EOS (ABSOLUTE): 0.2 10*3/uL (ref 0.0–0.4)
Eos: 3 %
Hematocrit: 40.7 % (ref 34.0–46.6)
Hemoglobin: 14.1 g/dL (ref 11.1–15.9)
Immature Grans (Abs): 0 10*3/uL (ref 0.0–0.1)
Immature Granulocytes: 0 %
Lymphocytes Absolute: 2 10*3/uL (ref 0.7–3.1)
Lymphs: 36 %
MCH: 30.8 pg (ref 26.6–33.0)
MCHC: 34.6 g/dL (ref 31.5–35.7)
MCV: 89 fL (ref 79–97)
Monocytes Absolute: 0.3 10*3/uL (ref 0.1–0.9)
Monocytes: 6 %
Neutrophils Absolute: 2.9 10*3/uL (ref 1.4–7.0)
Neutrophils: 54 %
Platelets: 302 10*3/uL (ref 150–450)
RBC: 4.58 x10E6/uL (ref 3.77–5.28)
RDW: 11.8 % (ref 11.7–15.4)
WBC: 5.4 10*3/uL (ref 3.4–10.8)

## 2022-01-01 LAB — HEMOGLOBIN A1C
Est. average glucose Bld gHb Est-mCnc: 97 mg/dL
Hgb A1c MFr Bld: 5 % (ref 4.8–5.6)

## 2022-01-01 LAB — LIPID PANEL
Chol/HDL Ratio: 2.5 ratio (ref 0.0–4.4)
Cholesterol, Total: 194 mg/dL (ref 100–199)
HDL: 79 mg/dL (ref >39)
LDL Chol Calc (NIH): 101 mg/dL — ABNORMAL HIGH (ref 0–99)
Triglycerides: 75 mg/dL (ref 0–149)
VLDL Cholesterol Cal: 14 mg/dL (ref 5–40)

## 2022-06-19 ENCOUNTER — Ambulatory Visit
Admission: RE | Admit: 2022-06-19 | Discharge: 2022-06-19 | Disposition: A | Discharge status: Home / Self Care | Payer: BC Managed Care – PPO | Source: Ambulatory Visit | Attending: Family Medicine | Admitting: Family Medicine

## 2022-06-19 DIAGNOSIS — Z1231 Encounter for screening mammogram for malignant neoplasm of breast: Secondary | ICD-10-CM | POA: Diagnosis present

## 2022-06-19 DIAGNOSIS — Z0000 Encounter for general adult medical examination without abnormal findings: Secondary | ICD-10-CM

## 2022-07-24 ENCOUNTER — Encounter: Payer: Self-pay | Admitting: Family Medicine

## 2022-07-24 ENCOUNTER — Ambulatory Visit (INDEPENDENT_AMBULATORY_CARE_PROVIDER_SITE_OTHER): Payer: BC Managed Care – PPO | Admitting: Family Medicine

## 2022-07-24 VITALS — BP 129/88 | HR 79 | Wt 169.4 lb

## 2022-07-24 DIAGNOSIS — H6690 Otitis media, unspecified, unspecified ear: Secondary | ICD-10-CM | POA: Diagnosis not present

## 2022-07-24 DIAGNOSIS — H6123 Impacted cerumen, bilateral: Secondary | ICD-10-CM | POA: Diagnosis not present

## 2022-07-24 DIAGNOSIS — Z789 Other specified health status: Secondary | ICD-10-CM

## 2022-07-24 DIAGNOSIS — M2651 Abnormal jaw closure: Secondary | ICD-10-CM | POA: Diagnosis not present

## 2022-07-24 MED ORDER — CIPROFLOXACIN-DEXAMETHASONE 0.3-0.1 % OT SUSP: 4.0000 [drp] | Freq: Two times a day (BID) | OTIC | 0 refills | Status: AC

## 2022-07-24 NOTE — Assessment & Plan Note (Signed)
Chronic, remains pre-contemplative 3-5 minute discuss regarding risks of tobacco/nicotine use and recommendations on ways to reduce use and work towards cessation of use of tobacco/nicotine products. Encouraged to use 1-800-QUIT-NOW.

## 2022-07-24 NOTE — Progress Notes (Signed)
I,Sha'taria Tyson,acting as a Neurosurgeon for Jacky Kindle, FNP.,have documented all relevant documentation on the behalf of Jacky Kindle, FNP,as directed by  Jacky Kindle, FNP while in the presence of Jacky Kindle, FNP.   Established patient visit   Patient: Mary Melton   DOB: [birth year]   42 y.o. Female  MRN: [id number] Visit Date: 07/24/2022  Today's healthcare provider: Jacky Kindle, FNP  Re Introduced to nurse practitioner role and practice setting.  All questions answered.  Discussed provider/patient relationship and expectations.  Subjective    Otalgia  There is pain in the right ear. This is a new problem. The current episode started 1 to 4 weeks ago. Episode frequency: mainly when wet. There has been no fever. The pain is at a severity of 2/10. The pain is mild. She has tried nothing for the symptoms. The treatment provided no relief.    Patient reports going to [facility] on Saturday where she had her ear irrigated but it cause pain and some bleeding. Was advised by the provider everything looked okay. Reports when she washed her hair and some water got into it she felt the pain again.   Medications: No outpatient medications prior to visit.   No facility-administered medications prior to visit.    Review of Systems  HENT:  Positive for ear pain.      Objective    BP 129/88 (BP Location: Left Arm, Patient Position: Sitting, Cuff Size: Normal)   Pulse 79   Wt 169 lb 6.4 oz (76.8 kg)   BMI 27.34 kg/m   Physical Exam Vitals and nursing note reviewed.  Constitutional:      General: She is not in acute distress.    Appearance: Normal appearance. She is overweight. She is not ill-appearing, toxic-appearing or diaphoretic.  HENT:     Head: Normocephalic and atraumatic.     Right Ear: Decreased hearing noted. Drainage, swelling and tenderness present. There is impacted cerumen. Tympanic membrane is erythematous. Tympanic membrane has decreased  mobility.     Left Ear: Decreased hearing noted.  Cardiovascular:     Rate and Rhythm: Normal rate and regular rhythm.     Pulses: Normal pulses.     Heart sounds: Normal heart sounds. No murmur heard.    No friction rub. No gallop.  Pulmonary:     Effort: Pulmonary effort is normal. No respiratory distress.     Breath sounds: Normal breath sounds. No stridor. No wheezing, rhonchi or rales.  Chest:     Chest wall: No tenderness.  Abdominal:     General: Bowel sounds are normal.     Palpations: Abdomen is soft.  Musculoskeletal:        General: No swelling, tenderness, deformity or signs of injury. Normal range of motion.     Right lower leg: No edema.     Left lower leg: No edema.  Skin:    General: Skin is warm and dry.     Capillary Refill: Capillary refill takes less than 2 seconds.     Coloration: Skin is not jaundiced or pale.     Findings: No bruising, erythema, lesion or rash.  Neurological:     General: No focal deficit present.     Mental Status: She is alert and oriented to person, place, and time. Mental status is at baseline.     Cranial Nerves: No cranial nerve deficit.     Sensory: No sensory deficit.  Motor: No weakness.     Coordination: Coordination normal.  Psychiatric:        Mood and Affect: Mood normal.        Behavior: Behavior normal.        Thought Content: Thought content normal.        Judgment: Judgment normal.      No results found for any visits on 07/24/22.  Assessment & Plan     Problem List Items Addressed This Visit       Nervous and Auditory   Excessive cerumen in both ear canals   Recurrent AOM (acute otitis media) - Primary    R ear with ongoing cerumen with c/f infection given tenderness and subjective fevers following shower Ear cleaned via gtts and irrigation; TM remains slightly inflamed and erythematous Encouraged to use antihistamine; flonase; and recommend f/u with ENT for ongoing concerns and to possible discuss  malocclusion treatment to assistance given reoccurrence       Relevant Orders   Ambulatory referral to ENT     Other   Electronic cigarette use    Chronic, remains pre-contemplative 3-5 minute discuss regarding risks of tobacco/nicotine use and recommendations on ways to reduce use and work towards cessation of use of tobacco/nicotine products. Encouraged to use 1-800-QUIT-NOW.       Malocclusion of jaws    Chronic, stable C/f poor dentition and ongoing ear issues; encouraged to discuss with ENT if desired      Return if symptoms worsen or fail to improve.     Leilani Merl, FNP, have reviewed all documentation for this visit. The documentation on 07/24/22 for the exam, diagnosis, procedures, and orders are all accurate and complete.  Jacky Kindle, FNP  [facility] [phone] (phone) [phone] (fax)  [facility]

## 2022-07-24 NOTE — Assessment & Plan Note (Signed)
R ear with ongoing cerumen with c/f infection given tenderness and subjective fevers following shower Ear cleaned via gtts and irrigation; TM remains slightly inflamed and erythematous Encouraged to use antihistamine; flonase; and recommend f/u with ENT for ongoing concerns and to possible discuss malocclusion treatment to assistance given reoccurrence

## 2022-07-24 NOTE — Assessment & Plan Note (Signed)
Chronic, stable C/f poor dentition and ongoing ear issues; encouraged to discuss with ENT if desired

## 2023-07-24 ENCOUNTER — Ambulatory Visit: Payer: Self-pay

## 2023-07-24 NOTE — Telephone Encounter (Signed)
   FYI Only or Action Required?: FYI only for provider.  Patient was last seen in primary care on 07/24/2022 by Normie Becton, FNP. Called Nurse Triage reporting Sleep Apnea. Symptoms began several months ago. Interventions attempted: Nothing. Symptoms are: unchanged.  Triage Disposition: See PCP Within 2 Weeks  Patient/caregiver understands and will follow disposition?: Yes  Copied from CRM [id number]. Topic: Clinical - Pink Word Triage >> Jul 24, 2023  8:12 AM Stanly Early wrote: Reason for Triage: patient stated her boyfriend tells her she stops breathing in her sleep. I offered an appt to get an referral for sleep study. Reason for Disposition  [1] MILD longstanding difficulty breathing AND [2]  SAME as normal  Answer Assessment - Initial Assessment Questions Patient states that her boyfriend reports that she stops breathing at night when she sleeps States that this most often happens when she is laying on her back.  She states that her boyfriend rolls her over to her side and she begins breathing normally.  She reports that she does not wake while this happens.  Patient also reports that she has difficulty falling asleep at night and remains sleepy throughout the day.  She would like to be evaluated for sleep apnea  Protocols used: Breathing Difficulty-A-AH

## 2023-07-28 ENCOUNTER — Encounter: Payer: Self-pay | Admitting: Family Medicine

## 2023-07-28 ENCOUNTER — Ambulatory Visit (INDEPENDENT_AMBULATORY_CARE_PROVIDER_SITE_OTHER): Admitting: Family Medicine

## 2023-07-28 VITALS — BP 109/68 | HR 93 | Ht 66.0 in | Wt 173.0 lb

## 2023-07-28 DIAGNOSIS — R0683 Snoring: Secondary | ICD-10-CM

## 2023-07-28 DIAGNOSIS — G4730 Sleep apnea, unspecified: Secondary | ICD-10-CM | POA: Diagnosis not present

## 2023-07-28 NOTE — Progress Notes (Signed)
 ACUTE VISIT   Patient: Mary Melton   DOB: May 06, 1980   43 y.o. Female  MRN: [id number]   PCP: Emilio Kelly DASEN, FNP  Chief Complaint  Patient presents with   Obstructive Sleep Apnea    She has been told by her partner she has stopped breathing while she is sleep, he has to turn her over to wake her, and she wakes up tired    Subjective    HPI HPI     Obstructive Sleep Apnea    Additional comments: She has been told by her partner she has stopped breathing while she is sleep, he has to turn her over to wake her, and she wakes up tired       Last edited by Thelbert Eulalio HERO, CMA on 07/28/2023  4:02 PM.       Discussed the use of AI scribe software for clinical note transcription with the patient, who gave verbal consent to proceed.  History of Present Illness Mary Melton is a 43 year old female who presents with concerns of sleep apnea.  Her partner has observed episodes of apnea during sleep, requiring her to be turned over to resume breathing. She has been aware of her snoring for a long time, but the apnea was noticed recently. She often feels tired upon waking, indicating non-restorative sleep.  She drives frequently for work and has had to pull over to take naps due to excessive daytime sleepiness. She can fall asleep quickly once settled but has difficulty settling down due to distractions and thoughts about the next day.  There is a family history of sleep apnea; her mother uses a CPAP machine, and her grandfather had one in his seventies.  She suspects a nasal issue since she has difficulty breathing through both nostrils simultaneously, possibly due to a past injury. She has been evaluated by ENT specialists but has not adhered to the recommended nasal spray treatment. She has tried nasal strips in the past but not recently.  Her daughters are at home but do not notice her snoring unless it is loud enough to be heard  through closed doors.     Medications: Outpatient Medications Prior to Visit  Medication Sig   ciprofloxacin -dexamethasone  (CIPRODEX ) OTIC suspension Place 4 drops into the right ear 2 (two) times daily. (Patient not taking: Reported on 07/28/2023)   No facility-administered medications prior to visit.        Objective    BP 109/68   Pulse 93   Ht 5' 6 (1.676 m)   Wt 173 lb (78.5 kg)   SpO2 99%   BMI 27.92 kg/m  BP Readings from Last 3 Encounters:  07/28/23 109/68  07/24/22 129/88  12/31/21 121/89   Wt Readings from Last 3 Encounters:  07/28/23 173 lb (78.5 kg)  07/24/22 169 lb 6.4 oz (76.8 kg)  12/31/21 169 lb 11.2 oz (77 kg)      Physical Exam HENT:     Mouth/Throat:     Mouth: Mucous membranes are moist. No oral lesions.     Pharynx: Oropharynx is clear. No oropharyngeal exudate or uvula swelling.   Cardiovascular:     Rate and Rhythm: Normal rate and regular rhythm.      Physical Exam MEASUREMENTS: BMI- 27.0. CHEST: Lungs clear to auscultation.   No results found for any visits on 07/28/23.  Assessment & Plan      Assessment & Plan Suspected Obstructive Sleep Apnea Symptoms  include snoring, observed apneas during sleep, and daytime fatigue, suggesting non-restorative sleep. Family history of sleep apnea with maternal and grandfather use of CPAP. BMI of 27, not typically associated with sleep apnea, but possible deviated septum may contribute. Consideration of central sleep apnea due to past congestive heart failure during labor, with potential implications for pulmonary hypertension and oxygenation. Advised against driving if sleepy due to accident risk. - Order home sleep study to evaluate for sleep apnea. - Advise to avoid driving if feeling sleepy. - Consider nasal strips to alleviate snoring and potential nasal obstruction.  Deviated Nasal Septum? History of nasal trauma at age 21 causing difficulty breathing through both nostrils. ENT  evaluation did not confirm deviated septum but prescribed nasal spray, which she has not consistently used. Discussed nasal strips as a non-invasive option to improve nasal airflow. - Encourage consistent use of nasal spray as prescribed by ENT. - Consider nasal strips to improve nasal airflow.   Follow-up Plans to follow up on sleep study results to determine management for suspected sleep apnea. Instructed to contact if no information is received about sleep study setup within 10 days. - Follow up in 10 days if no contact regarding sleep study setup.      No follow-ups on file.        Rockie Agent, MD  [facility] [phone] (phone) [phone] (fax)  [facility]

## 2023-09-03 ENCOUNTER — Ambulatory Visit: Admitting: Physician Assistant

## 2023-09-07 ENCOUNTER — Telehealth: Payer: Self-pay

## 2023-09-07 NOTE — Telephone Encounter (Signed)
 Copied from CRM [id number]. Topic: Referral - Question >> Sep 07, 2023 12:17 PM Delon DASEN wrote: Reason for CRM: patient inquiring about referral for sleep study please call [phone]

## 2023-10-07 ENCOUNTER — Ambulatory Visit: Payer: Self-pay

## 2023-10-07 NOTE — Telephone Encounter (Signed)
 FYI Only or Action Required?: Action required by provider: clinical question for provider.  Patient was last seen in primary care on 07/28/2023 by Sharma Coyer, MD.    Copied from CRM [id number]. Topic: Clinical - Lab/Test Results >> Oct 07, 2023 12:25 PM Precious C wrote: Reason for CRM: Patient called requesting follow up regarding her last office visit on 07/28/2023. During that visit, her provider ordered a home sleep study to evaluate for sleep apnea. Patient reports she never received any follow up regarding the results, but recently received a CPAP machine delivered to her home. She is confused as to why the CPAP was ordered without explanation of her sleep study results and would like clarification. Patient requests a follow up via telephone at [phone].

## 2023-10-08 NOTE — Telephone Encounter (Signed)
Please the message below

## 2023-10-08 NOTE — Telephone Encounter (Signed)
 I am not able to see a report for the sleep study in order to review the results but do see where she was given orders for CPAP equipment which leads me to believe the results were positive.   Please contact supplier for CPAP equipment for report of sleep study results so that I can review for the patient.

## 2023-10-08 NOTE — Telephone Encounter (Unsigned)
 Copied from CRM #[id number]. Topic: Clinical - Lab/Test Results >> Oct 08, 2023  8:36 AM Mary Melton wrote: Reason for CRM:  Patient called back stating she has not received a follow-up regarding her previous concern about not getting results for her home sleep study. I reviewed the provider's message in the chart and read it to the patient. I informed her that her provider did receive the message and is waiting on the supplier who set up the CPAP machine to provide results so they can be reviewed with her. Provider also noted that the patient must have tested positive since the CPAP machine/equipment was sent

## 2023-10-09 NOTE — Telephone Encounter (Signed)
 Reviewed sleep study report which showed that patient did have mild sleep apnea and noted that patient would likely benefit from CPAP use

## 2023-10-09 NOTE — Telephone Encounter (Signed)
 Spoke with patient, made aware of sleep study results. Patient states company has already shipped a cpap for her

## 2023-10-13 ENCOUNTER — Encounter: Payer: Self-pay | Admitting: Family Medicine

## 2024-03-17 ENCOUNTER — Encounter: Admitting: Family Medicine
